# Patient Record
Sex: Male | Born: 1955 | Race: White | Hispanic: No | Marital: Single | Smoking: Never smoker
Health system: Southern US, Community
[De-identification: ages and names within clinical notes are randomized; demographics above are authoritative.]

## PROBLEM LIST (undated history)

## (undated) DIAGNOSIS — D721 Eosinophilia, unspecified: Secondary | ICD-10-CM

## (undated) DIAGNOSIS — J45909 Unspecified asthma, uncomplicated: Secondary | ICD-10-CM

## (undated) HISTORY — DX: Eosinophilia: D72.1

## (undated) HISTORY — DX: Eosinophilia, unspecified: D72.10

## (undated) HISTORY — DX: Unspecified asthma, uncomplicated: J45.909

---

## 1999-07-29 ENCOUNTER — Encounter: Payer: Self-pay | Admitting: Sports Medicine

## 1999-07-29 ENCOUNTER — Ambulatory Visit (HOSPITAL_COMMUNITY): Admission: RE | Admit: 1999-07-29 | Discharge: 1999-07-29 | Payer: Self-pay | Admitting: Sports Medicine

## 2005-05-31 ENCOUNTER — Ambulatory Visit (HOSPITAL_COMMUNITY): Admission: RE | Admit: 2005-05-31 | Discharge: 2005-05-31 | Payer: Self-pay | Admitting: Specialist

## 2005-12-19 ENCOUNTER — Ambulatory Visit: Payer: Self-pay | Admitting: Internal Medicine

## 2005-12-19 ENCOUNTER — Ambulatory Visit: Payer: Self-pay | Admitting: Pulmonary Disease

## 2005-12-21 ENCOUNTER — Ambulatory Visit (HOSPITAL_COMMUNITY): Admission: RE | Admit: 2005-12-21 | Discharge: 2005-12-21 | Payer: Self-pay | Admitting: Pulmonary Disease

## 2006-01-11 ENCOUNTER — Ambulatory Visit: Payer: Self-pay | Admitting: Cardiology

## 2006-01-16 ENCOUNTER — Encounter: Admission: RE | Admit: 2006-01-16 | Discharge: 2006-01-16 | Payer: Self-pay | Admitting: Thoracic Surgery

## 2006-04-04 ENCOUNTER — Ambulatory Visit: Payer: Self-pay | Admitting: Pulmonary Disease

## 2006-04-04 LAB — PULMONARY FUNCTION TEST

## 2007-01-06 IMAGING — CT CT ANGIO CHEST
2 of 5 series · 18 of 36 positions shown · IV contrast (APPLIED)
Comparison: none

CLINICAL DATA: Shortness of breath.  Cough.  Weight loss. 
CT ANGIOGRAPHY OF CHEST:
TECHNIQUE: Multidetector CT imaging of the chest was performed during bolus injection of intravenous contrast.  Multiplanar CT angiographic image reconstructions were generated to evaluate the vascular anatomy.
Contrast:  80 cc Omnipaque 300

[Series 6: pe 1.0 b20f st · axial · 0.68mm/px · z∈[-345,-61]mm · 15 of 326 slices shown]
[im 21/326  lung]
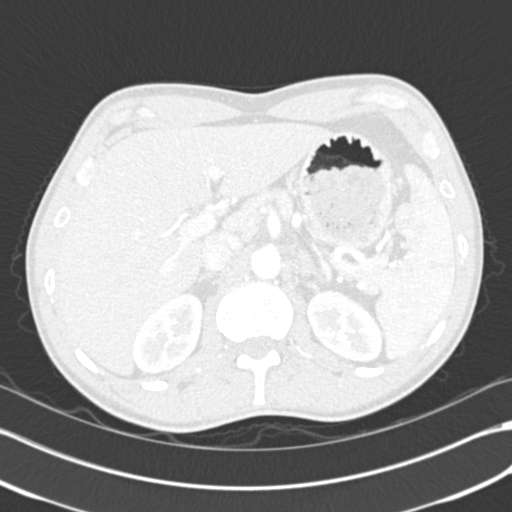
[im 41/326  mediastinal]
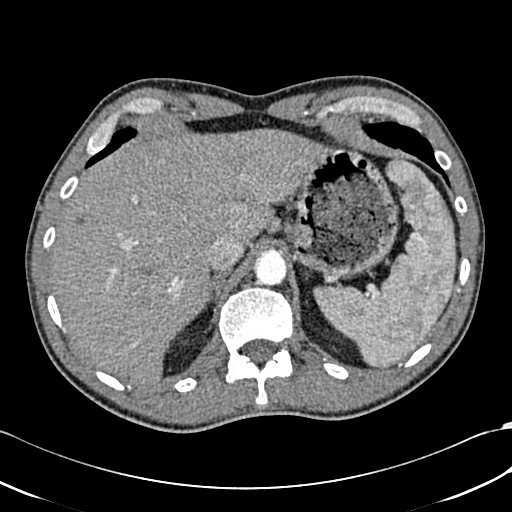
[im 61/326  lung]
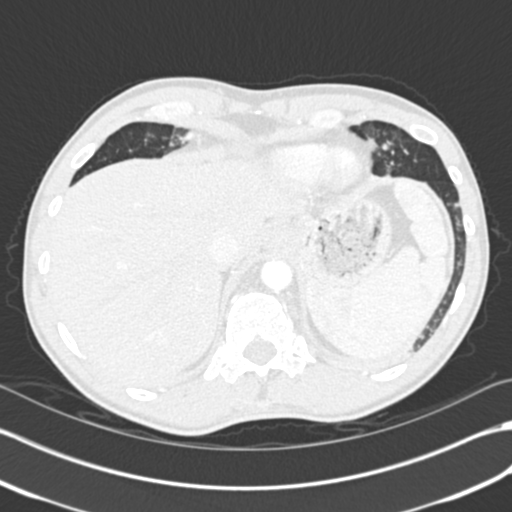
[im 82/326  mediastinal]
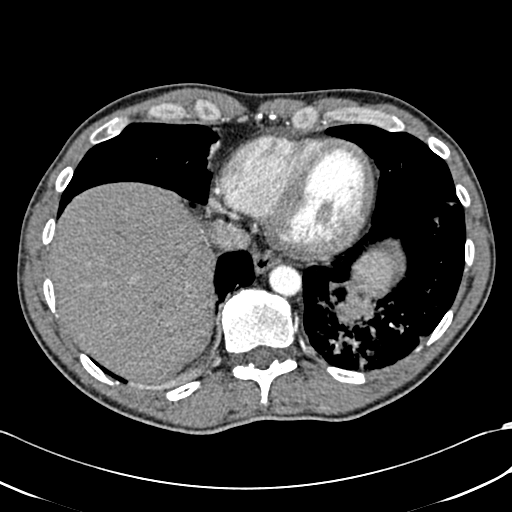
[im 102/326  lung]
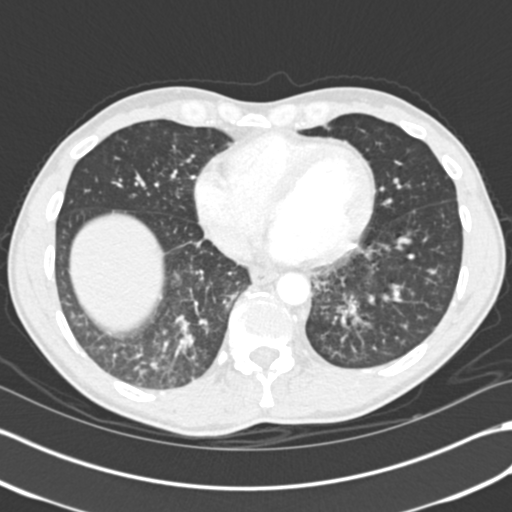
[im 122/326  mediastinal]
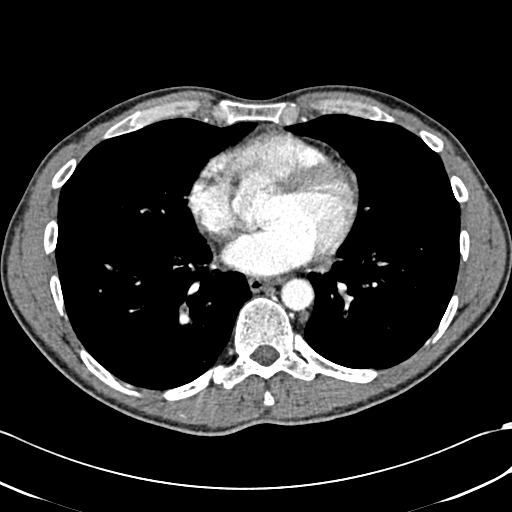
[im 143/326  lung]
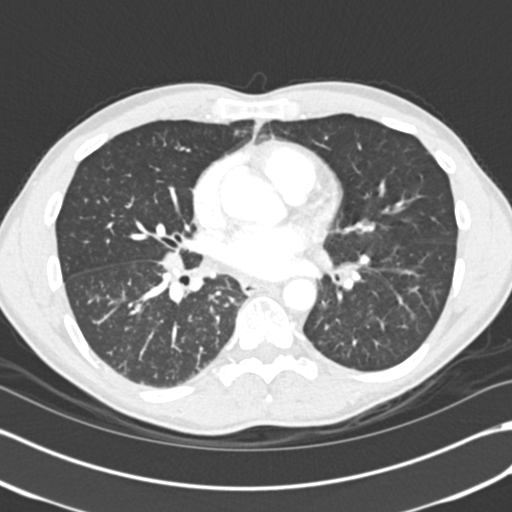
[im 163/326  mediastinal]
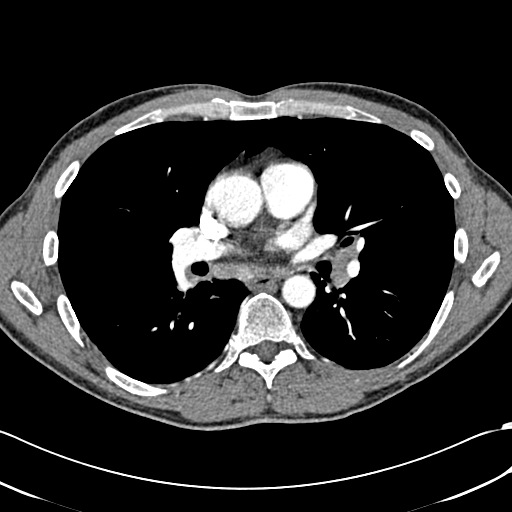
[im 183/326  lung]
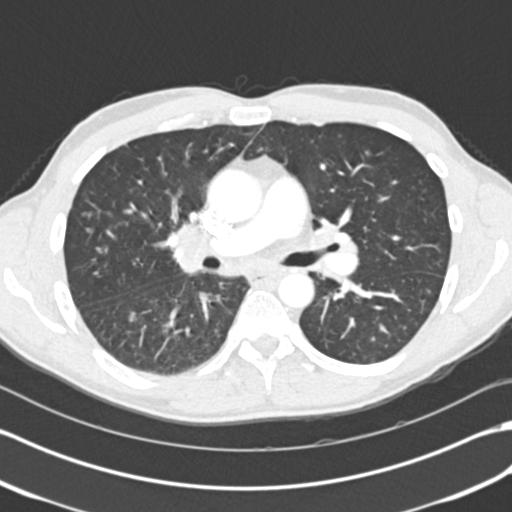
[im 204/326  mediastinal]
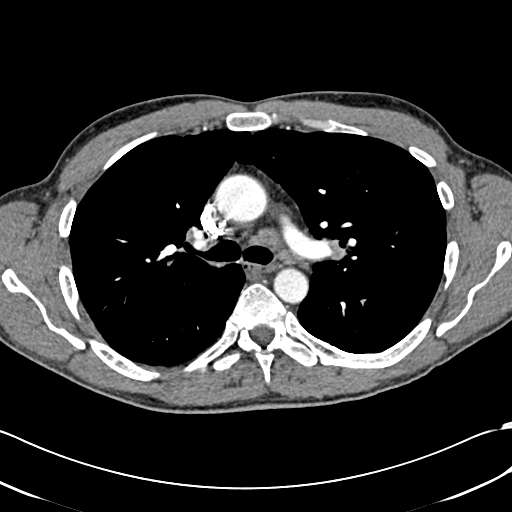
[im 224/326  lung]
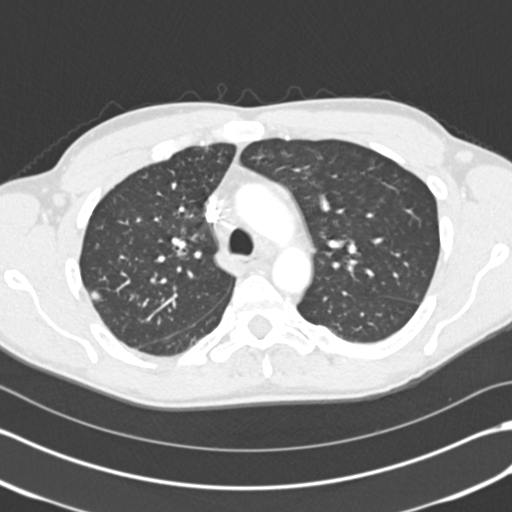
[im 244/326  mediastinal]
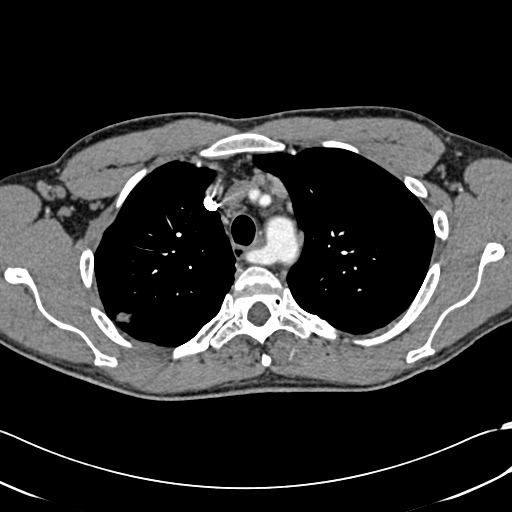
[im 265/326  lung]
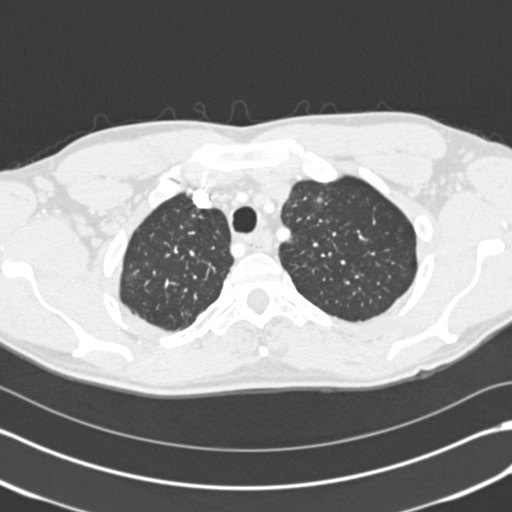
[im 285/326  mediastinal]
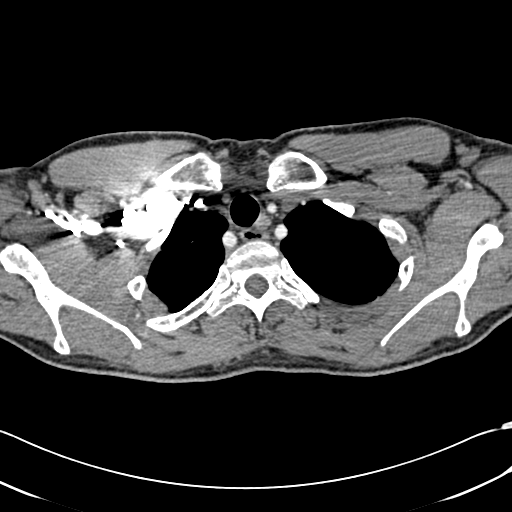
[im 305/326  lung]
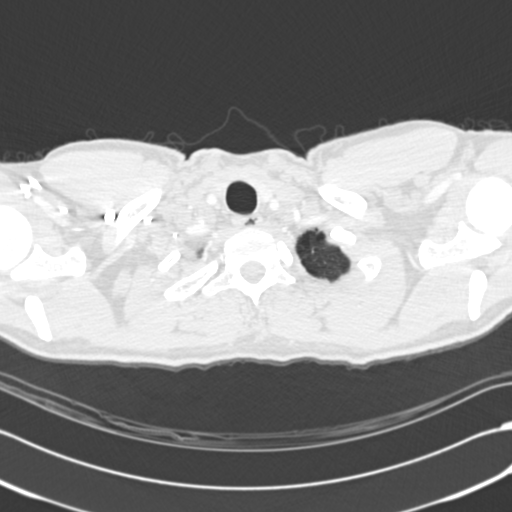

[Series 602: <mpr thick range> · coronal · 0.68mm/px · 3 of 39 slices shown]
[im 8/39  mediastinal]
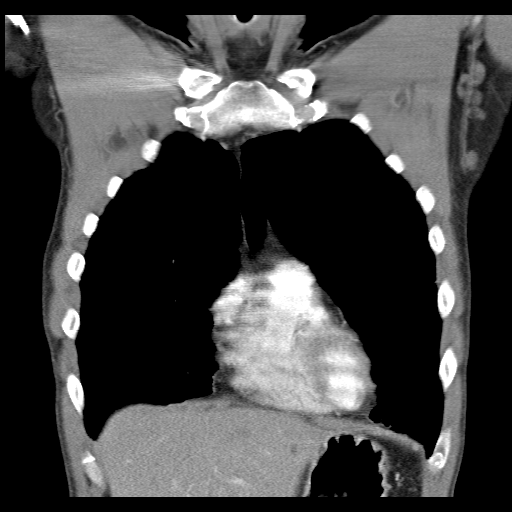
[im 16/39  mediastinal]
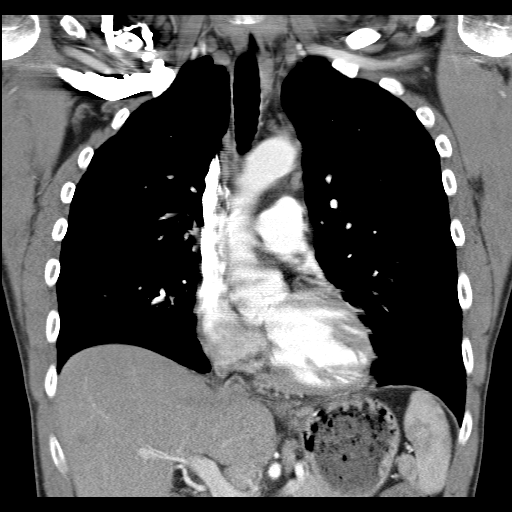
[im 23/39  mediastinal]
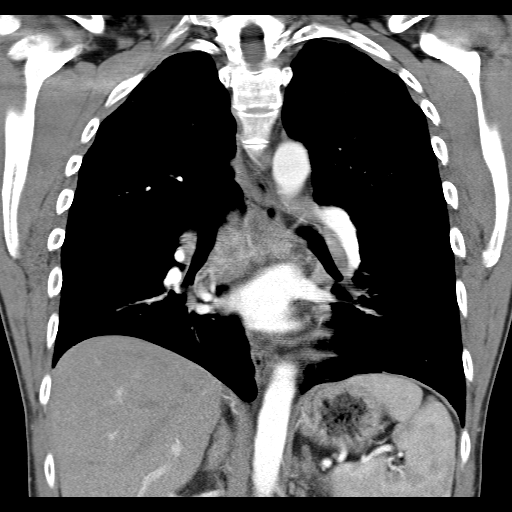

[18 of 36 positions shown; findings below may reference images not displayed]

FINDINGS: There is no CT scan evidence for pulmonary emboli.  There are bibasilar predominately interstitial infiltrative changes seen within the lung bases with a diffuse reticulo-nodular interstitial accentuation present particularly within the lower lobes.  In addition, there are ill-defined areas of nodularity seen within the lower lobes and also within the right upper lobe with the largest measurable nodule within the right upper lobe  measuring 8 x 14 mm in size as measured on image #27.  There are however innumerable smaller nodules present particularly within the lower lobes.  All of these nodules are ill-defined.  There is bilateral hilar adenopathy as well as mediastinal adenopathy with enlarged lymph nodes within the aorticopulmonary window region, paratracheal region, and subcarinal region.  An enlarged right hilar lymph node measures 2.2 x 3.0 cm on image #46.  An enlarged aorticopulmonary mediastinal lymph node measures 1.5 x 2.6 cm in size on image #39.  There are no pleural effusions.  Chest wall structures have a normal appearance.  There are mild enlarged bilateral axillary lymph nodes.  Also seen is aberrant origination of the right subclavian artery in the last branch from the aortic arch.
IMPRESSION: 1.  No CT scan evidence for pulmonary emboli.
2.  Bilateral hilar and diffuse mediastinal adenopathy as well as bilateral axillary adenopathy.  This is seen in association with interstitial lung disease, bibasilar predominately interstitial infiltrates, and multiple bilateral ill-defined parenchymal pulmonary nodules.  Major differential possibilities include sarcoidosis, lymphoma, metastatic disease, and immunocompromise states (AIDS) with or without opportunistic infections.  
3.  Incidentally noted is aberrant origin of the right subclavian artery as a distal branch of the aortic arch.

## 2007-01-08 IMAGING — CR DG CHEST 1V PORT
1 series · 1 of 1 positions shown · non-contrast
Comparison: none

CLINICAL DATA: Difficulty breathing status post bronchoscopy.  
 PORTABLE CHEST- 1 VIEW:
 An AP semierect portable film of the chest made 12/21/05 at 0402 hours is compared to a CT angio. of the chest dated 12/19/05 and shows diffuse peribronchial thickening with bilateral basilar atelectasis.  There is no definite pneumothorax or hemorrhage.  The heart and mediastinum appear to be within the normal limit.  Bony thorax appears normal.

[view not recorded]
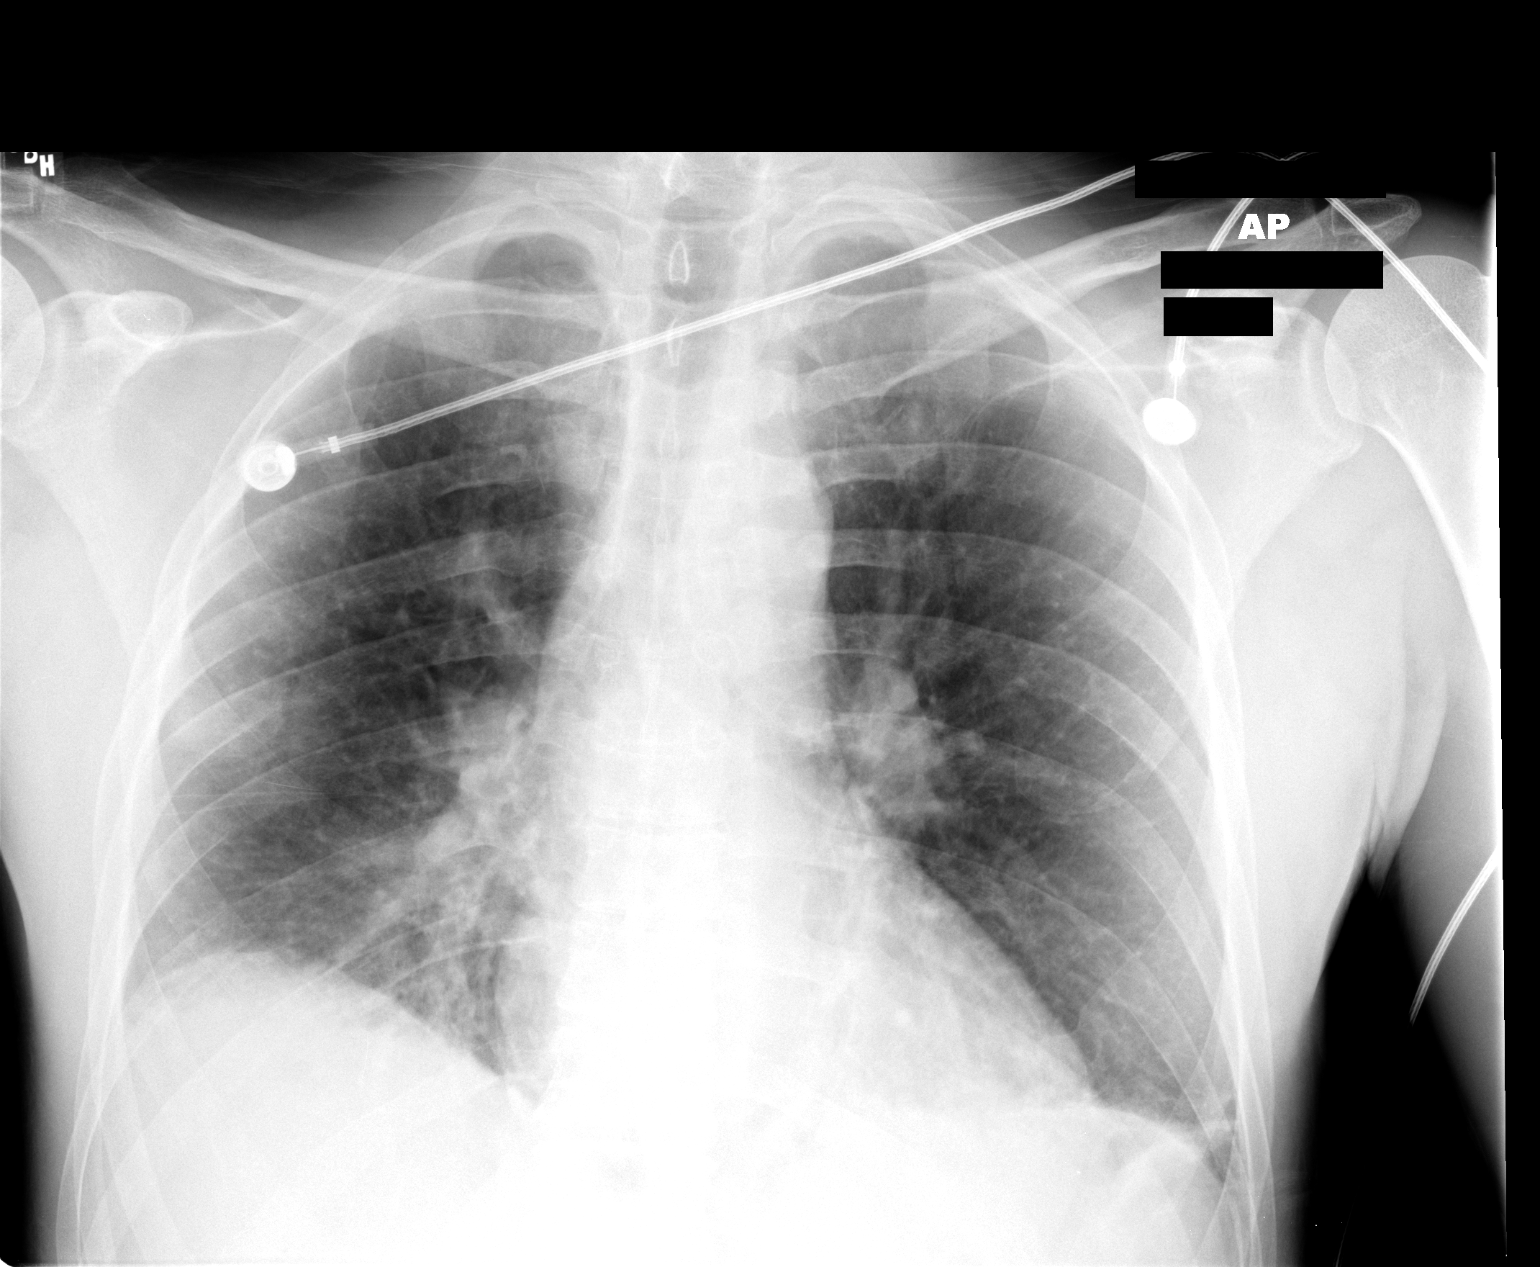

[1 of 1 positions shown; findings below may reference images not displayed]

IMPRESSION: Diffuse peribronchial thickening.  Bilateral basilar atelectasis.  No pneumothorax.

## 2007-06-15 LAB — PULMONARY FUNCTION TEST

## 2010-11-23 ENCOUNTER — Encounter: Payer: Self-pay | Admitting: Pulmonary Disease

## 2010-12-01 NOTE — Miscellaneous (Signed)
  Clinical Lists Changes  Medications: Added new medication of ASTEPRO 0.15 % SOLN (AZELASTINE HCL) 2 sprays each nostril once a day - Signed Rx of ASTEPRO 0.15 % SOLN (AZELASTINE HCL) 2 sprays each nostril once a day;  #1 x 12;  Signed;  Entered by: Barbaraann Share MD;  Authorized by: Barbaraann Share MD;  Method used: Electronically to [facility] [address], [place], [place]  [id number], Ph: [phone], Fax: [phone]    Prescriptions: ASTEPRO 0.15 % SOLN (AZELASTINE HCL) 2 sprays each nostril once a day  #1 x 12   Entered and Authorized by:   Barbaraann Share MD   Signed by:   Barbaraann Share MD on 11/23/2010   Method used:   Electronically to        [facility]* (retail)       [address]       [place], [place]  [id number]       Ph: [phone]       Fax: [phone]   RxID:   [id number]

## 2011-05-11 ENCOUNTER — Other Ambulatory Visit: Payer: Self-pay | Admitting: *Deleted

## 2011-05-11 MED ORDER — BUDESONIDE-FORMOTEROL FUMARATE 160-4.5 MCG/ACT IN AERO: 2.0000 | INHALATION_SPRAY | Freq: Two times a day (BID) | RESPIRATORY_TRACT | Status: DC

## 2011-05-11 MED ORDER — ALBUTEROL SULFATE HFA 108 (90 BASE) MCG/ACT IN AERS: 2.0000 | INHALATION_SPRAY | Freq: Four times a day (QID) | RESPIRATORY_TRACT | Status: DC | PRN

## 2011-11-09 ENCOUNTER — Other Ambulatory Visit: Payer: Self-pay | Admitting: Pulmonary Disease

## 2011-11-09 MED ORDER — ACYCLOVIR 200 MG PO CAPS: 400.0000 mg | ORAL_CAPSULE | Freq: Three times a day (TID) | ORAL | Status: DC

## 2012-01-31 ENCOUNTER — Other Ambulatory Visit: Payer: Self-pay | Admitting: Pulmonary Disease

## 2012-01-31 MED ORDER — PREDNISONE 10 MG PO TABS: ORAL_TABLET | ORAL | Status: DC

## 2012-04-30 ENCOUNTER — Ambulatory Visit (INDEPENDENT_AMBULATORY_CARE_PROVIDER_SITE_OTHER): Payer: Self-pay | Admitting: Pulmonary Disease

## 2012-04-30 ENCOUNTER — Encounter: Payer: Self-pay | Admitting: Pulmonary Disease

## 2012-04-30 VITALS — BP 112/82 | HR 62 | Temp 98.0°F | Ht 71.5 in | Wt 161.8 lb

## 2012-04-30 DIAGNOSIS — D721 Eosinophilia, unspecified: Secondary | ICD-10-CM | POA: Insufficient documentation

## 2012-04-30 DIAGNOSIS — J4530 Mild persistent asthma, uncomplicated: Secondary | ICD-10-CM | POA: Insufficient documentation

## 2012-04-30 DIAGNOSIS — J45901 Unspecified asthma with (acute) exacerbation: Secondary | ICD-10-CM

## 2012-04-30 DIAGNOSIS — J45909 Unspecified asthma, uncomplicated: Secondary | ICD-10-CM | POA: Insufficient documentation

## 2012-04-30 NOTE — Assessment & Plan Note (Signed)
I suspect the patient has an episode of acute asthmatic bronchitis, probably brought on by viral URI.  There is nothing to suggest an ongoing sinopulmonary infection at this time, but he is clearly having persistent airway inflammation that needs to be treated.  I will treat him with a course of prednisone, and have stressed to him the importance of getting back on symbicort twice a day.  If he does this, and continues to have flareups or doesn't return to baseline, he may need a chest x-ray and PFTs given his prior history.  He is to also continue his neil med sinus rinses.

## 2012-04-30 NOTE — Progress Notes (Signed)
  Subjective:    Patient ID: Roy Thomas, male    DOB: 04-27-1956, 56 y.o.   MRN: [id number]  HPI The pt comes in today for an acute sick visit.  He has known asthma, along with a distant h/o eosinophilic lung disease that is uncharacterized and resolved with a course of prednisone.  He was doing well until this year, and has had 3 "flareups" most c/w acute asthma exacerbation since that time.  He was treated with a course of prednisone in Feb with great improvement, and then started having increased asthma symptoms the end of June associated with viral URI/sinus symptoms.  He has not had purulent mucus from nose or chest, but has ongoing chest tightness with increased doe over baseline.  It turns out he has not been using symbicort but once a day.    Review of Systems  Constitutional: Negative for fever and unexpected weight change.  HENT: Negative for ear pain, nosebleeds, congestion, sore throat, rhinorrhea, sneezing, trouble swallowing, dental problem, postnasal drip and sinus pressure.   Eyes: Negative for redness and itching.  Respiratory: Positive for cough. Negative for chest tightness, shortness of breath and wheezing.   Cardiovascular: Negative for palpitations and leg swelling.  Gastrointestinal: Negative for nausea and vomiting.  Genitourinary: Negative for dysuria.  Musculoskeletal: Negative for joint swelling.  Skin: Negative for rash.  Neurological: Negative for headaches.  Hematological: Does not bruise/bleed easily.  Psychiatric/Behavioral: Negative for dysphoric mood. The patient is not nervous/anxious.   All other systems reviewed and are negative.       Objective:   Physical Exam Wd male in nad Nose with inflammed mucosa, no purulence OP clear Chest with one isolated wheeze in right base, good airflow bilat. Cor with rrr LE without edema, no cyanosis Alert and oriented, moves all 4.        Assessment & Plan:

## 2012-04-30 NOTE — Patient Instructions (Addendum)
Stay on symbicort bid as prescribed. Will treat with a course of prednisone to get you thru this episode. If you are not returning to baseline, you need to call me.

## 2012-07-13 ENCOUNTER — Other Ambulatory Visit: Payer: Self-pay | Admitting: Pulmonary Disease

## 2012-07-13 MED ORDER — AZELASTINE HCL 0.15 % NA SOLN: 2.0000 | Freq: Every day | NASAL | Status: DC

## 2012-07-13 NOTE — Telephone Encounter (Signed)
  Phone per Dr. Shelle Iron, refill pt's astepro for pt. Call to [facility] in [place] at [phone].

## 2012-08-30 ENCOUNTER — Other Ambulatory Visit: Payer: Self-pay | Admitting: Pulmonary Disease

## 2012-08-30 ENCOUNTER — Telehealth: Payer: Self-pay | Admitting: Pulmonary Disease

## 2012-08-30 MED ORDER — BUDESONIDE-FORMOTEROL FUMARATE 160-4.5 MCG/ACT IN AERO: 2.0000 | INHALATION_SPRAY | Freq: Two times a day (BID) | RESPIRATORY_TRACT | Status: DC

## 2012-08-30 NOTE — Telephone Encounter (Signed)
Needs to have his symbicort 160/4.5 sent [facility] [address], I think in [place]? [phone] #1 with 11 fills.

## 2012-08-30 NOTE — Telephone Encounter (Signed)
Symbicort RX called to the pharmacy, [facility] in [place].

## 2012-12-07 ENCOUNTER — Encounter: Payer: Self-pay | Admitting: Pulmonary Disease

## 2013-07-06 ENCOUNTER — Other Ambulatory Visit: Payer: Self-pay | Admitting: Pulmonary Disease

## 2013-07-06 DIAGNOSIS — B009 Herpesviral infection, unspecified: Secondary | ICD-10-CM

## 2013-07-06 MED ORDER — ACYCLOVIR 200 MG PO CAPS: 400.0000 mg | ORAL_CAPSULE | Freq: Three times a day (TID) | ORAL | Status: AC

## 2013-11-01 ENCOUNTER — Telehealth: Payer: Self-pay | Admitting: Pulmonary Disease

## 2013-11-01 MED ORDER — ACYCLOVIR 200 MG PO CAPS: 400.0000 mg | ORAL_CAPSULE | Freq: Three times a day (TID) | ORAL | Status: DC

## 2013-11-01 NOTE — Telephone Encounter (Signed)
Please send in a prescription for this pt:   Acyclovir 200mg , take 2 tid for 5 days.  One fill.   Send to : [facility], [address], [place].   Thanks.

## 2013-11-01 NOTE — Telephone Encounter (Signed)
Rx [facility] [place].

## 2013-11-06 ENCOUNTER — Telehealth: Payer: Self-pay | Admitting: Pulmonary Disease

## 2013-11-06 NOTE — Telephone Encounter (Signed)
Ashtyn, this pt had a recent cxr in [place], and a disk was sent by [employer] to me.  It apparently arrived today, and was signed for by J.Miller.  We need to get this disk asap since the pt is sick.  Thanks.

## 2013-11-07 NOTE — Telephone Encounter (Signed)
Dr Shelle Ironlance found disc in mail room Report [facility] Nothing further needed at this time.

## 2013-12-03 ENCOUNTER — Other Ambulatory Visit: Payer: Self-pay | Admitting: Pulmonary Disease

## 2013-12-04 ENCOUNTER — Telehealth: Payer: Self-pay | Admitting: Pulmonary Disease

## 2013-12-04 MED ORDER — BUDESONIDE-FORMOTEROL FUMARATE 160-4.5 MCG/ACT IN AERO: 2.0000 | INHALATION_SPRAY | Freq: Two times a day (BID) | RESPIRATORY_TRACT | Status: DC

## 2013-12-04 NOTE — Telephone Encounter (Signed)
Needs script sent to pharm in [place] for his symbicort #1 with 12 fills.

## 2013-12-04 NOTE — Telephone Encounter (Addendum)
[place]verified with Pharmacy Nothing further needed

## 2013-12-04 NOTE — Telephone Encounter (Signed)
Need to send in month to month prescription for his symbicort with 12 fills to his ;pharm in [place]

## 2014-09-27 ENCOUNTER — Other Ambulatory Visit: Payer: Self-pay | Admitting: Pulmonary Disease

## 2014-09-27 MED ORDER — BUDESONIDE-FORMOTEROL FUMARATE 160-4.5 MCG/ACT IN AERO: 2.0000 | INHALATION_SPRAY | Freq: Two times a day (BID) | RESPIRATORY_TRACT | Status: DC

## 2014-10-13 ENCOUNTER — Telehealth: Payer: Self-pay | Admitting: Pulmonary Disease

## 2014-10-13 MED ORDER — ACYCLOVIR 200 MG PO CAPS: 400.0000 mg | ORAL_CAPSULE | Freq: Three times a day (TID) | ORAL | Status: DC | PRN

## 2014-10-13 NOTE — Telephone Encounter (Signed)
Mindy, can you refill this pt's acyclovir.  [facility]. Thanks.

## 2014-10-13 NOTE — Telephone Encounter (Signed)
RX has been refilled. Nothing further needed 

## 2015-04-07 NOTE — Telephone Encounter (Signed)
error 

## 2016-02-03 ENCOUNTER — Telehealth: Payer: Self-pay

## 2016-02-03 NOTE — Telephone Encounter (Signed)
Per BQ, pt needs rov with cbc, cxr, and pft scheduled for next Wednesday afternoon at 1:30.  Pt declined pft, but aware of lab and cxr order placed, and will be seen at 1:30 next Wednesday by BQ.  Nothing further needed.

## 2016-02-10 ENCOUNTER — Ambulatory Visit (INDEPENDENT_AMBULATORY_CARE_PROVIDER_SITE_OTHER)
Admission: RE | Admit: 2016-02-10 | Discharge: 2016-02-10 | Disposition: A | Discharge status: Home / Self Care | Payer: Self-pay | Source: Ambulatory Visit | Attending: Pulmonary Disease | Admitting: Pulmonary Disease

## 2016-02-10 ENCOUNTER — Encounter: Payer: Self-pay | Admitting: Pulmonary Disease

## 2016-02-10 ENCOUNTER — Other Ambulatory Visit (INDEPENDENT_AMBULATORY_CARE_PROVIDER_SITE_OTHER): Payer: Self-pay

## 2016-02-10 ENCOUNTER — Telehealth: Payer: Self-pay | Admitting: Pulmonary Disease

## 2016-02-10 ENCOUNTER — Ambulatory Visit (INDEPENDENT_AMBULATORY_CARE_PROVIDER_SITE_OTHER): Payer: Self-pay | Admitting: Pulmonary Disease

## 2016-02-10 VITALS — BP 138/82 | HR 65 | Ht 71.5 in | Wt 162.0 lb

## 2016-02-10 DIAGNOSIS — D721 Eosinophilia, unspecified: Secondary | ICD-10-CM

## 2016-02-10 DIAGNOSIS — J45909 Unspecified asthma, uncomplicated: Secondary | ICD-10-CM

## 2016-02-10 DIAGNOSIS — J4530 Mild persistent asthma, uncomplicated: Secondary | ICD-10-CM

## 2016-02-10 DIAGNOSIS — J4540 Moderate persistent asthma, uncomplicated: Secondary | ICD-10-CM

## 2016-02-10 LAB — CBC WITH DIFFERENTIAL/PLATELET
Basophils Absolute: 0 10*3/uL (ref 0.0–0.1)
Basophils Relative: 0.5 % (ref 0.0–3.0)
EOS PCT: 2.4 % (ref 0.0–5.0)
Eosinophils Absolute: 0.1 10*3/uL (ref 0.0–0.7)
HEMATOCRIT: 49.3 % (ref 39.0–52.0)
Hemoglobin: 17.2 g/dL — ABNORMAL HIGH (ref 13.0–17.0)
LYMPHS ABS: 1.4 10*3/uL (ref 0.7–4.0)
LYMPHS PCT: 24.4 % (ref 12.0–46.0)
MCHC: 34.8 g/dL (ref 30.0–36.0)
MCV: 97.7 fl (ref 78.0–100.0)
MONOS PCT: 11.3 % (ref 3.0–12.0)
Monocytes Absolute: 0.7 10*3/uL (ref 0.1–1.0)
NEUTROS PCT: 61.4 % (ref 43.0–77.0)
Neutro Abs: 3.5 10*3/uL (ref 1.4–7.7)
Platelets: 220 10*3/uL (ref 150.0–400.0)
RBC: 5.05 Mil/uL (ref 4.22–5.81)
RDW: 13.2 % (ref 11.5–15.5)
WBC: 5.8 10*3/uL (ref 4.0–10.5)
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
## 2016-02-10 NOTE — Assessment & Plan Note (Signed)
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 I have first layer review the images from his CT chest at that time that showed a tree-in-bud abnormality in the bases of his lungs. Transbronchial biopsies showed eosinophilic infiltrate in the alveolar septi but not in the alveolar spaces. There is questionable eosinophils in the walls of the small arteries. The BAL only showed 12% eos. He was treated with steroids with a good clinical response.  His eosinophil count today was normal. His spirometry today was normal.  Since then, it sounds as if his symptoms have been consistent with mild persistent asthma. He has periodic flareups which seem to be consistent with environmental exposures rather than viral exposures or infections. In general, his symptoms are well controlled as illustrated by the fact that he was able to cycle over 40 miles at high-altitude this past weekend.  Plan: Spirometry today for baseline Continue Symbicort twice a day Call us for flareups Follow-up 6 months as we know he will be in town at that time, likely annually after that

## 2016-02-10 NOTE — Telephone Encounter (Signed)
Patient came by office to get orders put in for his CXR and CBC (see TE dated 02/03/16).  Orders have been entered.  Patient aware and has gone to have CXR and CBC done now.  FYI to [employer]

## 2016-02-10 NOTE — Patient Instructions (Signed)
Keep taking the Symbicort as you are doing We will see you back in 6 months or sooner if needed Please take note of the "my chart" information on this document which will give you information about how to contact me

## 2016-08-23 ENCOUNTER — Telehealth: Payer: Self-pay | Admitting: Pulmonary Disease

## 2016-08-23 MED ORDER — BUDESONIDE-FORMOTEROL FUMARATE 160-4.5 MCG/ACT IN AERO: 2.0000 | INHALATION_SPRAY | Freq: Two times a day (BID) | RESPIRATORY_TRACT | 12 refills | Status: DC

## 2016-08-23 MED ORDER — TRIAMCINOLONE ACETONIDE 55 MCG/ACT NA AERO: 1.0000 | INHALATION_SPRAY | Freq: Every day | NASAL | 6 refills | Status: DC | PRN

## 2016-08-23 NOTE — Telephone Encounter (Signed)
Called and spoke with pt and he stated that his work has made [place] at least [place].  He stated that he has had labs done and the cbcd has been about the same as back in April. He will schedule an appt once he gets back into town.  Will forward to BQ to make him aware.

## 2016-08-24 NOTE — Telephone Encounter (Signed)
Noted, thanks!

## 2017-05-02 ENCOUNTER — Other Ambulatory Visit: Payer: Self-pay | Admitting: Pulmonary Disease

## 2017-06-21 ENCOUNTER — Telehealth: Payer: Self-pay | Admitting: Pulmonary Disease

## 2017-06-22 MED ORDER — BUDESONIDE-FORMOTEROL FUMARATE 160-4.5 MCG/ACT IN AERO: 2.0000 | INHALATION_SPRAY | Freq: Two times a day (BID) | RESPIRATORY_TRACT | 2 refills | Status: DC

## 2017-06-22 MED ORDER — ALBUTEROL SULFATE HFA 108 (90 BASE) MCG/ACT IN AERS: INHALATION_SPRAY | RESPIRATORY_TRACT | 2 refills | Status: DC

## 2017-06-22 NOTE — Telephone Encounter (Signed)
LM for patient x 1 
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
## 2017-06-22 NOTE — Telephone Encounter (Signed)
Agree, thanks

## 2017-08-17 ENCOUNTER — Telehealth: Payer: Self-pay | Admitting: Pulmonary Disease

## 2017-08-17 MED ORDER — BUDESONIDE-FORMOTEROL FUMARATE 160-4.5 MCG/ACT IN AERO: 2.0000 | INHALATION_SPRAY | Freq: Two times a day (BID) | RESPIRATORY_TRACT | 11 refills | Status: DC

## 2017-08-17 NOTE — Telephone Encounter (Signed)
lmtcb x1 for pt. 

## 2017-08-17 NOTE — Telephone Encounter (Signed)
Yes ok to refill, 11 refills

## 2017-08-17 NOTE — Telephone Encounter (Signed)
Attempted to fax signed Rx to provided number and received busy signal multiple times.  lmtcb x1 for pt to verify fax number. Rx is hanging up on brown brown in triage.

## 2017-08-17 NOTE — Telephone Encounter (Signed)
Pt is aware of below message and voiced his understanding.  Rx has been printed and placed on BQ's desk for signature.
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
## 2017-08-17 NOTE — Telephone Encounter (Signed)
Patient returning call - he can be reached at [phone]

## 2017-08-18 NOTE — Telephone Encounter (Signed)
Spoke with the pt  He asked that I take a pic on a smart phone and send it to him and he will take it to the pharm  I have done so on my personal phone  Pt will call back if they will not accept this

## 2017-08-18 NOTE — Telephone Encounter (Signed)
The fax number can not be correct b/c there are too many numbers  LMTCB for the pt

## 2017-08-18 NOTE — Telephone Encounter (Addendum)
I tried faxing to the number given, and again it did not go through  CrismanSpoke with the pt and he now wants it to be emailed  Unsure how to accommodate him when we do not email rxs  Skype sent to EE to help with this

## 2017-08-18 NOTE — Telephone Encounter (Signed)
Pt called back and left a fax# and email address to have prescription sent.the patient contact # [phone]  Fax # [phone] Email: [email or web address]

## 2018-04-10 ENCOUNTER — Telehealth: Payer: Self-pay | Admitting: Pulmonary Disease

## 2018-04-10 NOTE — Telephone Encounter (Signed)
Called and spoke with pt to see what test pt was requested to have done by BQ before an OV is scheduled.  Pt states he will be driving from [place] leaving Saturday, 6/22 and states it might be Tuesday, 6/22 before he is in the states and is going to be in  until July 4.  Dr. Kendrick FriesMcQuaid, please advise if pt does need to have any testing done prior to OV. Pt states he believes a PFT might have been mentioned but wants to make sure before scheduling appt.

## 2018-04-10 NOTE — Telephone Encounter (Signed)
Called patient unable to reach left message to give us a call back.

## 2018-04-10 NOTE — Telephone Encounter (Signed)
Spirometry and FENO

## 2018-04-11 NOTE — Telephone Encounter (Signed)
Pt has been scheduled for OV with Elisha HeadlandBrian Mack on 04/23/18. Pt is aware and voiced his understanding. Added spiro and feno to apt note per below message.  Nothing further is needed.   Routing to BQ as an [place].

## 2018-04-11 NOTE — Telephone Encounter (Signed)
Prefer me, but if I don't have any availability then NP and if I'm in office I can pop in and say hi while he is there

## 2018-04-11 NOTE — Telephone Encounter (Signed)
Patient returning call.  CB is [phone].

## 2018-04-11 NOTE — Telephone Encounter (Signed)
Dr. Kendrick FriesMcQuaid, please advise if you want pt to see you directly or if it is okay to schedule pt with an APP?  Pt needs the appt to be on a day 6/26-7/3 as that is when he will be in town before he drives back to [place].

## 2018-04-11 NOTE — Telephone Encounter (Signed)
Attempted to call pt. I did not receive an answer. I have left a message for pt to return our call.  

## 2018-04-23 ENCOUNTER — Ambulatory Visit (INDEPENDENT_AMBULATORY_CARE_PROVIDER_SITE_OTHER): Payer: Self-pay | Admitting: Pulmonary Disease

## 2018-04-23 ENCOUNTER — Encounter: Payer: Self-pay | Admitting: Pulmonary Disease

## 2018-04-23 ENCOUNTER — Other Ambulatory Visit: Payer: Self-pay | Admitting: Pulmonary Disease

## 2018-04-23 ENCOUNTER — Ambulatory Visit (INDEPENDENT_AMBULATORY_CARE_PROVIDER_SITE_OTHER)
Admission: RE | Admit: 2018-04-23 | Discharge: 2018-04-23 | Disposition: A | Discharge status: Home / Self Care | Payer: Self-pay | Source: Ambulatory Visit | Attending: Pulmonary Disease | Admitting: Pulmonary Disease

## 2018-04-23 VITALS — BP 142/88 | HR 61 | Ht 71.0 in | Wt 155.0 lb

## 2018-04-23 DIAGNOSIS — D721 Eosinophilia, unspecified: Secondary | ICD-10-CM

## 2018-04-23 DIAGNOSIS — J4530 Mild persistent asthma, uncomplicated: Secondary | ICD-10-CM

## 2018-04-23 DIAGNOSIS — B370 Candidal stomatitis: Secondary | ICD-10-CM

## 2018-04-23 LAB — POCT EXHALED NITRIC OXIDE: FENO LEVEL (PPB): 22

## 2018-04-23 MED ORDER — NYSTATIN 100000 UNIT/ML MT SUSP: 5.0000 mL | Freq: Four times a day (QID) | OROMUCOSAL | 2 refills | Status: DC

## 2018-04-23 NOTE — Assessment & Plan Note (Signed)
  Thrush  >>> Nystatin Rinse  >>> Will call order into [place] pharmacy >>>We will let them know that you will not be able to pick up until after 04/30/18

## 2018-04-23 NOTE — Assessment & Plan Note (Signed)
FeNo today  >>>22 Come back in 2 months  >>>Pulmonary Function Test at next appointment    Continue Symbicort  >>>2 puffs in the morning, rinse mouth, 2 puffs 12 hours later, rinse mouth   Follow-up in 2 months.  Contact our office as soon as you know when you will be able to coordinate a trip to [place] so we can get you scheduled with Dr. Kendrick FriesMcquaid as well as get your pulmonary function test scheduled.

## 2018-04-23 NOTE — Progress Notes (Signed)
Continue call the patient and let him know that his chest x-ray today was normal.  Showing no acute issues.  After consulting with Dr. Kendrick FriesMcquaid Dr. Kendrick FriesMcquaid is okay with us proceeding forward with a PRN prednisone order.  We will write for prednisone 20 mg with 30 tablets no refills.  Patient can take 20 mg of prednisone daily for 5 days, take with food.  Use this if you are having exacerbation.    Please place this order.   Notify our office whenever you need utilize this.  Continue with plan of following up with our office in 2 months to see Dr. Kendrick FriesMcquaid before moving to [place].  Elisha HeadlandBrian Mack, FNP

## 2018-04-23 NOTE — Progress Notes (Signed)
 @Patient  ID: Roy Thomas, male    DOB: 05-11-1956, 62 y.o.   MRN: [id number]  Chief Complaint  Patient presents with  . Follow-up    States he has been up all night. Has questions about Churg-strauss syndrome. States he had 3 exacerbatins within the last year. lasting 6-7 weeks.     Referring provider: No ref. provider found  HPI: This is a lifelong nonsmoker and a former patient of Roy Thomas who was first evaluated in [place] by pulmonary in 2007. At that time he presented with significant shortness of breath and cough and had a CT scan which showed significant tree-in-bud abnormalities in the bases of both lungs. A bronchoscopy was performed which showed elevated eosinophils (12%) and transbronchial biopsies which showed an eosinophilic infiltrate in the small vessels and in the alveolar septi. He was treated with prednisone and he had improvement in symptoms. Since then he's taken Symbicort and has done fairly well. He has been treated intermittently for asthmatic flares but has not had recurrence of the abnormal findings on chest x-ray. Pt lives in [place]. Does not have pulmonologist in [place].   Recent [facility] Encounters:     02/10/16 - OV - McQuaid  See my summary above created today. Mr. 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 In 2007 as detailed above he presented and was treated. See that above Since then he's been living in [place] and will intermittently returned to [place]. He says that he's done quite well recently. However, in August 2016 he did have a flareup of shortness of breath and cough and was treated with a brief prednisone taper. He says that when he has flareups it tends to be more related to his environmental exposures. He does not often get viral infections or other infections. He says that dust,  sometimes asphalt will change the quality of his breathing. However, he's been very consistent with his Symbicort use for the last several years and he says that this has controlled his symptoms significantly. He remains quite active. On Saturday he went for a bicycle ride which was 45 miles in length somewhere between 8 and 11,000 feet above sea level. He says that he tolerated that well. He has intermittently had his eosinophil count checked over the years at point of care lab tests and he says that they have been higher when these had more symptoms in [place]. 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 He is frequently exposed to various dusts and chemicals and fumes. He has never smoked.   Tests:  Presented 2007 with severe dyspnea, cough, and abnormal cxr with pfts showing airflow obstruction. CT chest 2007:  Basilar interstitial and nodular infiltrates bilat with bilat hilar and mediastinal LN Blood eosinophilia 2007:  12% on diff IgE 141, HP panel negative, ANCA negative Bronch 2007:  BAL with 9550 cells, 12% eos, negative cultures TBBX 2007:  Eosinophilic infiltrate in alveolar septae, but not in alveolar spaces.  +eosinophils in walls of small arteries?? ++response to steroids [facility] [place]:  Recommended VATS bx just as I had, but pt declined.   April 2017 eosinophil count 100 cells per microliter, spirometry normal   Imaging:   04/23/2018-chest x-ray-heart size and mediastinal contours, visualized skeletal structures are unremarkable.  No active cardiopulmonary disease.  Cardiac:   02/10/2016-spirometry-normal spirometry  Labs:   Micro:   Chart Review:    04/23/18 OV 62 year old patient seen for follow-up appointment today.  Patient was a former patient of Roy Thomas and has seen Roy Thomas one time in 2017.  Patient lives in [place], [place].  But is potentially moving to [place] in  2 to 3 months.  Patient is presenting today for follow-up appointment as well as to see if we will continue to help manage respiratory status when he lives in [place].  Patient also looking to receive a as needed supply of prednisone for when he travels to [place].  FeNo today is 22.  Spirometry unfortunately today is broken.   Patient reports that over the last year and a half patient has had 3 episodes that are primarily respiratory driven.  Patient reports that his health has been poor over the last 16 months.  But the past 2 weeks he has had a good interval.  Patient is not established with any pulmonology team in [place].  Patient [place]  Patient does report [facility]up with [facility] but 7 hours away care and quality of care is better here.  Patient reports that he is still doing well, still riding his bike, still rockclimbing, has received telephonic care from friends who are medical providers.  He reports that back in February/2019 through March/2019 he had his last bout of a respiratory exacerbation.  Patient did not receive any sort of acute treatment at that time.  Patient does report that in summer 2018 patient needed a prednisone taper to tapers in order to overcome respiratory status.   No Known Allergies   There is no immunization history on file for this patient.  Past Medical History:  Diagnosis Date  . Asthma   . Eosinophilic disorder     Tobacco History: Social History   Tobacco Use  Smoking Status Never Smoker  Smokeless Tobacco Never Used   Counseling given: Not Answered   Outpatient Encounter Medications as of 04/23/2018  Medication Sig  . albuterol (PROAIR HFA) 108 (90 Base) MCG/ACT inhaler inhale 2 puffs by mouth every 6 hours if needed  . budesonide-formoterol (SYMBICORT) 160-4.5 MCG/ACT inhaler Inhale 2 puffs into the lungs 2 (two) times daily.  Marland Kitchen triamcinolone (NASACORT ALLERGY 24HR) 55 MCG/ACT AERO nasal inhaler  Place 1 spray into the nose daily as needed.  . Azelastine HCl 0.15 % SOLN Place 2 sprays into the nose daily.   No facility-administered encounter medications on file as of 04/23/2018.      Review of Systems  Constitutional:   No  weight loss, night sweats,  fevers, chills, fatigue, or  lassitude HEENT: +occasional thrush   No headaches,  Difficulty swallowing,  Tooth/dental problems, No sneezing, itching, ear ache, nasal congestion, post nasal drip  CV: No chest pain,  orthopnea, PND, swelling in lower extremities, anasarca, dizziness, palpitations, syncope  GI: No heartburn, indigestion, abdominal pain, nausea, vomiting, diarrhea, change in bowel habits, loss of appetite, bloody stools Resp: +occasional episodes of SOB No shortness of breath at rest.  No excess mucus, no productive cough,  No non-productive cough,  No coughing up of blood.  No change in color of mucus.  No wheezing.  No chest wall deformity Skin: no rash, lesions, no skin changes. GU: no dysuria, change in color of urine, no urgency or frequency.  No flank pain, no hematuria  MS:  No joint  pain or swelling.  No decreased range of motion.  No back pain. Psych:  No change in mood or affect. No depression or anxiety.  No memory loss.   Physical Exam  BP (!) 142/88   Pulse 61   Ht 5\' 11"  (1.803 m)   Wt 155 lb (70.3 kg)   SpO2 98%   BMI 21.62 kg/m   Wt Readings from Last 3 Encounters:  04/23/18 155 lb (70.3 kg)  02/10/16 162 lb (73.5 kg)  04/30/12 161 lb 12.8 oz (73.4 kg)    GEN: A/Ox3; pleasant , NAD, well nourished    HEENT:  [place]/AT,  EACs-clear, TMs-wnl, NOSE-clear, THROAT- +thrush with few ulcers   NECK:  Supple w/ fair ROM; no JVD; normal carotid impulses w/o bruits; no thyromegaly or nodules palpated; no lymphadenopathy.    RESP:  Clear  P & A;  slightly diminished breath sounds in bases, no wheezes/ rales/ or rhonchi. no accessory muscle use, no dullness to percussion  CARD:  RRR, no m/r/g, no peripheral  edema, pulses intact, no cyanosis or clubbing.  GI:   Soft & nt; nml bowel sounds; no organomegaly or masses detected.   Musco: Warm bil, no deformities or joint swelling noted.   Neuro: alert, no focal deficits noted.    Skin: Warm, no lesions or rashes    Lab Results:  CBC    Component Value Date/Time   WBC 5.8 02/10/2016 1141   RBC 5.05 02/10/2016 1141   HGB 17.2 Repeated and verified X2. (H) 02/10/2016 1141   HCT 49.3 02/10/2016 1141   PLT 220.0 02/10/2016 1141   MCV 97.7 02/10/2016 1141   MCHC 34.8 02/10/2016 1141   RDW 13.2 02/10/2016 1141   LYMPHSABS 1.4 02/10/2016 1141   MONOABS 0.7 02/10/2016 1141   EOSABS 0.1 02/10/2016 1141   BASOSABS 0.0 02/10/2016 1141    BMET No results found for: NA, K, CL, CO2, GLUCOSE, BUN, CREATININE, CALCIUM, GFRNONAA, GFRAA  BNP No results found for: BNP  ProBNP No results found for: PROBNP  Imaging: Dg Chest 2 View  Result Date: 04/23/2018 CLINICAL DATA:  No chest complaints.  History of asthma. EXAM: CHEST - 2 VIEW COMPARISON:  02/10/2016 FINDINGS: The heart size and mediastinal contours are within normal limits. Both lungs are clear. The visualized skeletal structures are unremarkable. IMPRESSION: No active cardiopulmonary disease. Electronically Signed   By: Elige Ko   On: 04/23/2018 09:39     Assessment & Plan:   Pleasant patient seen office today.  This is a stable interval for Mr. Marcello.  Patient is planning on moving to [place] in the next 3 to 4 months.  Discussed with patient he needs to follow-up with our office in 2 months or sooner to complete a pulmonary function test as well as to see Roy Thomas.  Patient says that he will call as soon as he knows when he is moving and when he can be back in [place] so that we we can coordinate this.  Patient is requesting that we continue to support and help manage his care when he moved to [place].  I told him that he needs to complete his appointment with Dr.  Kendrick Thomas before we can proceed forward with considering this.  Patient also requesting PRN prednisone doses for when he is traveling in [place].  I informed him that we would need to see him as well as discussed with Roy Thomas.  Also will need to complete pulmonary function testing.  FeNo today is 22.  Unfortunately spirometry is broken in the office and we are unable to obtain these results.  Try to coordinate patient to get pulmonary function test today at 2 PM, patient declined being able to do this he said today is a busy day.  Chest x-ray today looks good.  Thrush present on exam today.  Will write for nystatin.  Patient requests that this be sent to [facility].  We will do so today.  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  Contact our office as soon as you know when you will be able to coordinate a trip to [place] so we can get you scheduled with Roy Thomas as well as get your pulmonary function test scheduled.      Coral Ceo, NP 04/23/2018

## 2018-04-23 NOTE — Assessment & Plan Note (Signed)
FeNo today  >>>22 Come back in 2 months  >>>Pulmonary Function Test at next appointment    Continue Symbicort  >>>2 puffs in the morning, rinse mouth, 2 puffs 12 hours later, rinse mouth

## 2018-04-23 NOTE — Patient Instructions (Addendum)
FeNo today  >>>22 Come back in 2 months  >>>Pulmonary Function Test at next appointment    Continue Symbicort  >>>2 puffs in the morning, rinse mouth, 2 puffs 12 hours later, rinse mouth   Thrush  >>> Nystatin Rinse  >>> Will call order into [place] pharmacy >>>We will let them know that you will not be able to pick up until after 04/30/18  Please contact our office if you have any changes in your respiratory status, or any more exacerbations.  Also letter office know if you do get treated with any prednisone tapers for your respiratory status.  Please contact the office if your symptoms worsen or you have concerns that you are not improving.   Thank you for choosing [facility] for your healthcare, and for allowing us to partner with you on your healthcare journey. I am thankful to be able to provide care to you today.   Elisha HeadlandBrian Yechezkel Fertig FNP-C

## 2018-04-23 NOTE — Progress Notes (Signed)
Discussed results with patient in office today.  Nothing further is needed at this time.  Elisha HeadlandBrian Lovinia Snare, FNP

## 2018-04-25 ENCOUNTER — Other Ambulatory Visit: Payer: Self-pay | Admitting: Pulmonary Disease

## 2018-04-25 MED ORDER — PREDNISONE 20 MG PO TABS: ORAL_TABLET | ORAL | 0 refills | Status: DC

## 2018-05-01 ENCOUNTER — Telehealth: Payer: Self-pay | Admitting: Pulmonary Disease

## 2018-05-01 NOTE — Telephone Encounter (Signed)
Called and spoke with pt letting him know the acyclovir was avail OTC and did not require an Rx.  Pt expressed understanding. Nothing further needed.

## 2018-05-03 ENCOUNTER — Telehealth: Payer: Self-pay | Admitting: Pulmonary Disease

## 2018-05-03 NOTE — Telephone Encounter (Signed)
Spoke with pt. He is requesting a prescription for Acyclovir. Pt called in yesterday and was told this was OTC and it's not. Advised him that I would have to speak to Arlys JohnBrian about this.  Arlys JohnBrian - please advise if you would be willing to prescribe this. Thanks!

## 2018-05-03 NOTE — Telephone Encounter (Signed)
Patient needs to follow-up with primary care regarding this.  This is not something that we are managing.  Roy HeadlandBrian Mack, FNP

## 2018-05-03 NOTE — Telephone Encounter (Signed)
Attempted to contact pt. I did not receive an answer. There was no option for me to leave a message. Will try back.  

## 2018-05-04 NOTE — Telephone Encounter (Signed)
Attempted to contact pt. I did not receive an answer. There was no option for me to leave a message. Will try back.  

## 2018-05-08 NOTE — Telephone Encounter (Signed)
Attempted to contact pt. I did not receive an answer. There was no option for me to leave a message. Will try back.  

## 2018-05-09 NOTE — Telephone Encounter (Signed)
We have attempted to contact the pt several times with no success or call back from the pt. Per triage protocol, message will be closed.  

## 2018-06-27 ENCOUNTER — Ambulatory Visit: Payer: Self-pay | Admitting: Pulmonary Disease

## 2018-08-02 ENCOUNTER — Telehealth: Payer: Self-pay | Admitting: Pulmonary Disease

## 2018-08-02 NOTE — Telephone Encounter (Signed)
Per telephone encounter from 05/03/18 and per Arlys John, this will need to come from his PCP.  Attempted to call pt. I did not receive an answer. I have left a message for pt to return our call.

## 2018-08-02 NOTE — Telephone Encounter (Signed)
Spoke with pt. Pt is requesting a prescription for Acyclovir. He is aware of the information below. Pt became very upset and said that this prescription came from our office originally. Dr. Shelle Iron started this prescription for him back in 2013.  BQ - please advise if you would be willing to write this prescription for him.

## 2018-08-03 MED ORDER — ACYCLOVIR 200 MG PO CAPS: 400.0000 mg | ORAL_CAPSULE | Freq: Three times a day (TID) | ORAL | 1 refills | Status: DC | PRN

## 2018-08-03 NOTE — Telephone Encounter (Signed)
OK to refill

## 2018-08-03 NOTE — Telephone Encounter (Signed)
Called and spoke to patient, patient states he was taking it for cold sores.   BQ please advise?

## 2018-08-03 NOTE — Telephone Encounter (Signed)
I'm not sure what I'm treating.  What does he need the acyclovir for?

## 2018-08-03 NOTE — Addendum Note (Signed)
Addended by: Wyvonne Lenz on: 08/03/2018 03:41 PM   Modules accepted: Orders

## 2018-08-03 NOTE — Telephone Encounter (Signed)
Called and spoke with pt letting him know BQ said it would be okay for [place] to refill the acyclovir for him  Pt expressed understanding. Rx has been sent to preferred pharmacy. Nothing further needed.

## 2018-08-07 ENCOUNTER — Ambulatory Visit: Payer: Self-pay | Admitting: Pulmonary Disease

## 2018-08-24 ENCOUNTER — Other Ambulatory Visit: Payer: Self-pay | Admitting: Pulmonary Disease

## 2018-08-24 ENCOUNTER — Ambulatory Visit: Payer: Self-pay | Admitting: Pulmonary Disease

## 2018-08-24 DIAGNOSIS — J4530 Mild persistent asthma, uncomplicated: Secondary | ICD-10-CM

## 2018-08-27 ENCOUNTER — Ambulatory Visit (INDEPENDENT_AMBULATORY_CARE_PROVIDER_SITE_OTHER): Payer: Self-pay | Admitting: Pulmonary Disease

## 2018-08-27 ENCOUNTER — Encounter: Payer: Self-pay | Admitting: Pulmonary Disease

## 2018-08-27 VITALS — BP 122/74 | HR 65 | Ht 69.0 in | Wt 152.0 lb

## 2018-08-27 DIAGNOSIS — J4550 Severe persistent asthma, uncomplicated: Secondary | ICD-10-CM

## 2018-08-27 DIAGNOSIS — J4530 Mild persistent asthma, uncomplicated: Secondary | ICD-10-CM

## 2018-08-27 DIAGNOSIS — D721 Eosinophilia, unspecified: Secondary | ICD-10-CM

## 2018-08-27 DIAGNOSIS — B370 Candidal stomatitis: Secondary | ICD-10-CM

## 2018-08-27 LAB — PULMONARY FUNCTION TEST
DL/VA % pred: 94 %
DL/VA: 4.29 ml/min/mmHg/L
DLCO unc % pred: 100 %
DLCO unc: 31.19 ml/min/mmHg
FEF 25-75 PRE: 2.12 L/s
FEF 25-75 Post: 2.44 L/sec
FEF2575-%Change-Post: 15 %
FEF2575-%PRED-PRE: 74 %
FEF2575-%Pred-Post: 86 %
FEV1-%Change-Post: 4 %
FEV1-%PRED-POST: 98 %
FEV1-%PRED-PRE: 94 %
FEV1-PRE: 3.24 L
FEV1-Post: 3.39 L
FEV1FVC-%Change-Post: 2 %
FEV1FVC-%Pred-Pre: 90 %
FEV6-%CHANGE-POST: 2 %
FEV6-%PRED-POST: 111 %
FEV6-%Pred-Pre: 108 %
FEV6-POST: 4.84 L
FEV6-Pre: 4.74 L
FEV6FVC-%Change-Post: 0 %
FEV6FVC-%PRED-POST: 104 %
FEV6FVC-%Pred-Pre: 104 %
FVC-%Change-Post: 2 %
FVC-%Pred-Post: 106 %
FVC-%Pred-Pre: 104 %
FVC-Post: 4.86 L
FVC-Pre: 4.76 L
POST FEV6/FVC RATIO: 100 %
PRE FEV1/FVC RATIO: 68 %
Post FEV1/FVC ratio: 70 %
Pre FEV6/FVC Ratio: 100 %
RV % pred: 126 %
RV: 2.79 L
TLC % PRED: 111 %
TLC: 7.58 L

## 2018-08-27 MED ORDER — OSELTAMIVIR PHOSPHATE 75 MG PO CAPS: 75.0000 mg | ORAL_CAPSULE | Freq: Two times a day (BID) | ORAL | 0 refills | Status: AC

## 2018-08-27 MED ORDER — AZELASTINE HCL 0.15 % NA SOLN: 2.0000 | Freq: Every day | NASAL | 11 refills | Status: DC

## 2018-08-27 MED ORDER — ALBUTEROL SULFATE HFA 108 (90 BASE) MCG/ACT IN AERS: INHALATION_SPRAY | RESPIRATORY_TRACT | 11 refills | Status: DC

## 2018-08-27 MED ORDER — NYSTATIN 100000 UNIT/ML MT SUSP: 5.0000 mL | Freq: Four times a day (QID) | OROMUCOSAL | 2 refills | Status: DC

## 2018-08-27 MED ORDER — BUDESONIDE-FORMOTEROL FUMARATE 160-4.5 MCG/ACT IN AERO: 2.0000 | INHALATION_SPRAY | Freq: Two times a day (BID) | RESPIRATORY_TRACT | 11 refills | Status: DC

## 2018-08-27 MED ORDER — ACYCLOVIR 200 MG PO CAPS: 400.0000 mg | ORAL_CAPSULE | Freq: Three times a day (TID) | ORAL | 11 refills | Status: DC | PRN

## 2018-08-27 MED ORDER — PREDNISONE 20 MG PO TABS: 20.0000 mg | ORAL_TABLET | Freq: Every day | ORAL | 0 refills | Status: DC

## 2018-08-27 NOTE — Patient Instructions (Signed)
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: Fill a prescription of prednisone to have on hand in the event of illness Continue Symbicort 2 puffs twice a day no matter how you feel Practice good hand hygiene Stay active Tamiflu prescribed today to use for prophylaxis if exposed to flu: Use 1 tablet daily Use albuterol as needed for chest tightness wheezing or shortness of breath In the event of recurrent exacerbations I think he should strongly consider being treated with a drug called dupilumab, this should be available in [place]  History of oral HSV: Acyclovir refilled  Oral thrush: Richard mouth frequently Use nystatin as needed Consider using catheter over the next few days  Follow-up in 1 year or sooner if needed

## 2018-08-27 NOTE — Progress Notes (Signed)
PFT completed today.  

## 2018-08-27 NOTE — Progress Notes (Signed)
Subjective:    Patient ID: Roy Thomas, male    DOB: 1955-10-25, 62 y.o.   MRN: [id number]  Synopsis: This is a lifelong nonsmoker and a former patient of Dr. Shelle Iron who was first evaluated in [place] by pulmonary in 2007. At that time he presented with significant shortness of breath and cough and had a CT scan which showed significant tree-in-bud abnormalities in the bases of both lungs. A bronchoscopy was performed which showed elevated eosinophils (12%) and transbronchial biopsies which showed an eosinophilic infiltrate in the small vessels and in the alveolar septi. He was treated with prednisone and he had improvement in symptoms. Since then he's taken Symbicort and has done fairly well. He has been treated intermittently for asthmatic flares but has not had recurrence of the abnormal findings on chest x-ray.  Presented 2007 with severe dyspnea, cough, and abnormal cxr with pfts showing airflow obstruction. ++response to steroids Referred to [facility] in [place]:  Recommended VATS bx  but pt declined.   HPI Chief Complaint  Patient presents with  . Follow-up    better than average    Roy Thomas says that in 2017 he had more problems with dyspnea after I saw him.  He thinks that he may have set this off by riding his bike in hot weather too much.  He says that he had an illness in July of that year and by Christmas day he became incredibly ill and he had a ton of mucus in his chest and he couldn't lay flat.  He was very short of breath and the symptoms lasted for at least 2 months.  He said that it was May of th next year that he finally felt somewhat better.  He would get sick again repeatedly about every 4 months.  He says taht in summer 2018 he was in [place] because of work and he was exposed to a ton of smoke.  At this time he became quite ill again and was sick in bed for a week with dyspnea, cough.  He says that his friends were worried that he was going to die.  He eventually  recovered again over several months.  He says that by April 2019 he became ill again with some dyspnea and cough.  Since May 2019 he has been steadily recoverying.  He has been back to biking again and rock climbing.    Past Medical History:  Diagnosis Date  . Asthma   . Eosinophilic disorder         Review of Systems  Constitutional: Negative for fever and unexpected weight change.  HENT: Positive for congestion, postnasal drip and sinus pressure. Negative for dental problem, ear pain, nosebleeds, rhinorrhea, sneezing, sore throat and trouble swallowing.   Eyes: Negative for redness and itching.  Respiratory: Negative for cough, chest tightness, shortness of breath and wheezing.   Cardiovascular: Negative for palpitations and leg swelling.  Gastrointestinal: Negative for nausea and vomiting.  Genitourinary: Negative for dysuria.  Musculoskeletal: Negative for joint swelling.  Skin: Negative for rash.  Neurological: Negative for headaches.  Hematological: Does not bruise/bleed easily.  Psychiatric/Behavioral: Negative for dysphoric mood. The patient is not nervous/anxious.        Objective:   Physical Exam  Vitals:   08/27/18 1447  BP: 122/74  Pulse: 65  SpO2: 100%  Weight: 152 lb (68.9 kg)  Height: 5\' 9"  (1.753 m)   Room air  Gen: well appearing HENT: OP clear, TM's clear, neck supple PULM: Faint  wheezing left base B, normal percussion CV: RRR, no mgr, trace edema GI: BS+, soft, nontender Derm: no cyanosis or rash Psyche: normal mood and affect   Chest imaging: CT chest 2007:  Basilar interstitial and nodular infiltrates bilat with bilat hilar and mediastinal LN  Path: Bronch 2007:  BAL with 9550 cells, 12% eos, negative cultures TBBX 2007:  Eosinophilic infiltrate in alveolar septae, but not in alveolar spaces.  +eosinophils in walls of small arteries??     Labs: Blood eosinophilia 2007:  12% on diff IgE 141, HP panel negative, ANCA negative April 2017  eosinophil count 100 cells per microliter, spirometry normal  November 2019 pulmonary function testing ratio 68%, FEV1 3.39 L 98% predicted, 4% change with bronchodilator, forced vital capacity 4.9 L 106% predicted, total lung capacity 7.6 L 111% predicted, DLCO 31.19 100% predicted  Exhaled nitric oxide test July 2000 1922 ppb       Assessment & Plan:   Eosinophilic disorder  Mild persistent asthma without complication - Plan: nystatin (MYCOSTATIN) 100000 UNIT/ML suspension  Thrush - Plan: nystatin (MYCOSTATIN) 100000 UNIT/ML suspension  Severe persistent asthma without complication  Discussion: 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  He has had quite a bit of episodes of illness over the last several months however today he is as good as we have ever seen him.  His lung function testing is with when normal limits.  He is about to move to [place] and so he would like [place] to fill all of his prescriptions so that he can fill them in a pharmacy over there.  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: Fill a prescription of prednisone to have on hand in the event of illness Continue Symbicort 2 puffs twice a day no matter how you feel Practice good hand hygiene Stay active Tamiflu prescribed today to use for prophylaxis if exposed to flu: Use 1 tablet daily Use albuterol as needed for chest tightness wheezing or shortness of breath In the event of recurrent exacerbations I think he should strongly consider being treated with a drug called dupilumab, this should be available in [place]  History of oral HSV: Acyclovir refilled  Oral thrush: Richard mouth frequently Use nystatin as needed Consider using catheter over the next few days  Follow-up in 1 year or  sooner if needed  Your than 50% of this 42-minute visit spent face-to-face    Current Outpatient Medications:  .  acyclovir (ZOVIRAX) 200 MG capsule, Take 2 capsules (400 mg total) by mouth 3 (three) times daily as needed., Disp: 30 capsule, Rfl: 11 .  albuterol (PROAIR HFA) 108 (90 Base) MCG/ACT inhaler, inhale 2 puffs by mouth every 6 hours if needed, Disp: 1 Inhaler, Rfl: 11 .  budesonide-formoterol (SYMBICORT) 160-4.5 MCG/ACT inhaler, Inhale 2 puffs into the lungs 2 (two) times daily., Disp: 1 Inhaler, Rfl: 11 .  Azelastine HCl 0.15 % SOLN, Place 2 sprays into the nose daily., Disp: 30 mL, Rfl: 11 .  nystatin (MYCOSTATIN) 100000 UNIT/ML suspension, Take 5 mLs (500,000 Units total) by mouth 4 (four) times daily., Disp: 60 mL, Rfl: 2 .  oseltamivir (TAMIFLU) 75 MG capsule, Take 1 capsule (75 mg total) by mouth 2 (two) times daily for 5 days., Disp: 10 capsule, Rfl: 0 .  predniSONE (DELTASONE) 20 MG tablet, Take one tablet by mouth x 5 days prn, Take with food, use if  having an exacerbation. (Patient not taking: Reported on 08/27/2018), Disp: 30 tablet, Rfl: 0 .  predniSONE (DELTASONE) 20 MG tablet, Take 1 tablet (20 mg total) by mouth daily with breakfast., Disp: 30 tablet, Rfl: 0 .  triamcinolone (NASACORT ALLERGY 24HR) 55 MCG/ACT AERO nasal inhaler, Place 1 spray into the nose daily as needed. (Patient not taking: Reported on 08/27/2018), Disp: 1 Inhaler, Rfl: 6

## 2018-10-15 ENCOUNTER — Telehealth: Payer: Self-pay | Admitting: Pulmonary Disease

## 2018-10-15 NOTE — Telephone Encounter (Signed)
Patient returned phone call. °

## 2018-10-15 NOTE — Telephone Encounter (Signed)
Call made to patient, made patient aware we are not able to send anything to [place]. Patient states he has done it before and he does not understand why we cannot do it now. I explained that our system is not set up to be able to prescribe to [place]. Patient states his chest is filling up with mucous and it is his chest that should we should be concerned with and he does not understand why he cannot get his prednisone order sent to [place]. Patient continued to repeat over and over how this was discussed in the office visit and several staff member were aware. He states the printed prescriptions that were given to him was for his routine dose of prednisone not his exacerbations.  Patient went on to explain that he could send me a copy of the script and that we should abide by the protocol that BQ already has in place to him. Phone call has been routed to BT for f/u as phone call has been transferred.

## 2018-10-15 NOTE — Telephone Encounter (Signed)
Patient was transferred to speak with me. Patient stated that he has a back up Rx for prednisone if needed and has his maintenance Symbicort. Patient reported that he would like something prescribed for him, sent to [place]. Patient stated that Dr. Kendrick FriesMcQuaid has done this for him in the past. Advised him that the NP did not feel comfortable sending anything in without seeing him. Advised patient to try to be seen over there due to the report of the chest filling up with mucous.  Patient wants message sent to Dr. Kendrick FriesMcQuaid, patient aware the BQ is on vacation, however back in the hospital 10/22/18. Patient stated he will check back next week to see if Dr. Kendrick FriesMcQuaid has read this message.   Routing to BQ.

## 2018-10-15 NOTE — Telephone Encounter (Signed)
LMTCB. We are not able to electronically prescribe to [place], [place].

## 2018-10-15 NOTE — Telephone Encounter (Signed)
Called and spoke with pt who stated within the last 10 days, pt has had a lot of congestion in his chest, sore throat, head congestion, and also increased SOB.  Pt states he is coughing up mucus and states it is yellow in color. Pt is unsure is he has been running a fever.  Pt does go back and forth from [place] to [place] but pt stated he has temporarily relocated to [place] which pt stated BQ does know this.  Pt would like to have something be prescribed to help with his symptoms as he is unable to come to our office for an appt due to being in [place].  Sending to APP of day. Arlys JohnBrian, please advise on this for pt. Thanks!

## 2018-10-15 NOTE — Telephone Encounter (Signed)
I am sorry to hear the patient is not feeling well.  If the patient is located in [place], [place] I do not believe that we can E prescribe him any sort of medications to help with the symptoms.  Unsure what help we can do located in [place] and him being in [place].  I suggest that he follow-up with a provider in [place], [place] to receive treatment.  Elisha HeadlandBrian Marquon Alcala FNP

## 2018-10-19 NOTE — Telephone Encounter (Signed)
LMTCB for pt 

## 2018-10-19 NOTE — Telephone Encounter (Signed)
Send a fax with Rx  Prednisone 60mg  daily x 3 days, 40mg  daily x 3 days, 20mg  daily x 3 days Doxycycline 100mg  po bid x 5 days  Alternatively he may be able to just show this note from me stating this is what I want him to take as often a prescription is not needed to fill an Rx in other countries.

## 2018-10-22 NOTE — Telephone Encounter (Signed)
Called and spoke with patient, advised him of response below, patient stated that he had to have the actual prescription to show the pharmacy in [place] to get the medication. Patient requested that we send it through a text message. Advised patient that per protocol I could not do that. Patient stated that he would see if he could find a fax number for us to send it to and call us back. When speaking with Robynn Panelise she stated that we could upload the prescription in mychart to see if the patient could print that off and take it with him. I called patient back to see if he was active in mychart so that we could do this. I was unable to reach patient, left him a message to give us a call back.

## 2018-10-23 NOTE — Telephone Encounter (Signed)
Called and spoke with pt. Pt stated he will not have access to a fax machine for a few days. Asked pt if he had mychart and pt stated he did not have it but when I stated to him that we might be able to upload the Rx via mychart, pt stated that he would go to try to get mychart and would call our office back Thursday, 10/25/18 if he had been able to get access to mychart.

## 2018-10-29 NOTE — Telephone Encounter (Signed)
Patient called back stating MyChart is unavailable outside of the [place].  He states tomorrow he will try a fax number.  CB is [phone].

## 2018-10-29 NOTE — Telephone Encounter (Signed)
Will hold in triage until tomorrow, 10/30/18.

## 2018-10-30 MED ORDER — PREDNISONE 20 MG PO TABS: ORAL_TABLET | ORAL | 0 refills | Status: DC

## 2018-10-30 MED ORDER — DOXYCYCLINE HYCLATE 100 MG PO TABS: 100.0000 mg | ORAL_TABLET | Freq: Two times a day (BID) | ORAL | 0 refills | Status: DC

## 2018-10-30 NOTE — Telephone Encounter (Signed)
Pt is returning call. Cb is [phone].

## 2018-10-30 NOTE — Telephone Encounter (Signed)
I have attempted to fax Rx's but according to [facility], we do not have an international plan on our fax machine. I called pt to let him know and he would like [place] to mail the Rx's to him but through [employer]. He states he has an account with them and will call us back to give [place] the information so he can cover the cost. Will await return call for information. He also requested we print out a copy of all Rx's that were renewed in November. I have attached this to the prescriptions and placed into the triage bin. Will hold in triage.

## 2018-10-30 NOTE — Telephone Encounter (Signed)
Called patient, unable to reach left message to give us a call back. 

## 2018-10-30 NOTE — Telephone Encounter (Signed)
Spoke with pt, he states mychart is unavailable and provided the fax number we could try to fax these prescriptions.   [place] code 33  [phone]  Attn: Alberteen Spindle

## 2018-10-31 NOTE — Telephone Encounter (Signed)
Spoke with patient this morning-states he has not been able to get [employer] information but should be able to do so today by closing or first thing Thursday morning.

## 2018-11-01 ENCOUNTER — Telehealth: Payer: Self-pay | Admitting: Pulmonary Disease

## 2018-11-01 DIAGNOSIS — B370 Candidal stomatitis: Secondary | ICD-10-CM

## 2018-11-01 DIAGNOSIS — J4530 Mild persistent asthma, uncomplicated: Secondary | ICD-10-CM

## 2018-11-01 MED ORDER — AZELASTINE HCL 0.15 % NA SOLN: 2.0000 | Freq: Every day | NASAL | 11 refills | Status: DC

## 2018-11-01 MED ORDER — NYSTATIN 100000 UNIT/ML MT SUSP: 5.0000 mL | Freq: Four times a day (QID) | OROMUCOSAL | 2 refills | Status: DC

## 2018-11-01 MED ORDER — BUDESONIDE-FORMOTEROL FUMARATE 160-4.5 MCG/ACT IN AERO: 2.0000 | INHALATION_SPRAY | Freq: Two times a day (BID) | RESPIRATORY_TRACT | 11 refills | Status: DC

## 2018-11-01 MED ORDER — ACYCLOVIR 200 MG PO CAPS: 400.0000 mg | ORAL_CAPSULE | Freq: Three times a day (TID) | ORAL | 11 refills | Status: DC | PRN

## 2018-11-01 MED ORDER — TRIAMCINOLONE ACETONIDE 55 MCG/ACT NA AERO: 1.0000 | INHALATION_SPRAY | Freq: Every day | NASAL | 6 refills | Status: DC | PRN

## 2018-11-01 MED ORDER — ALBUTEROL SULFATE HFA 108 (90 BASE) MCG/ACT IN AERS: INHALATION_SPRAY | RESPIRATORY_TRACT | 11 refills | Status: DC

## 2018-11-01 NOTE — Telephone Encounter (Signed)
See phone note dated 11/01/2018. Will close this encounter. This has been followed up on.

## 2018-11-01 NOTE — Telephone Encounter (Signed)
Called and spoke with patient, see phone note also dated 10/15/2018. Patient has given [place] a mailing address to mail the prescriptions to. From that address they will then be forward to the patient is [place]. While on the phone with the patient he stated that he would need actual printed prescriptions of all medications written by BQ not just the medication name printed. I have printed these for the patient. While on the phone with the patient I verified the address we were to send the prescriptions to. The address is [address] [place], [place] [address] with attention to Roselie Awkward. I have placed these in the outgoing mail. Nothing further needed. Will also close other encounter.

## 2018-11-19 ENCOUNTER — Telehealth: Payer: Self-pay | Admitting: Pulmonary Disease

## 2018-11-19 DIAGNOSIS — B370 Candidal stomatitis: Secondary | ICD-10-CM

## 2018-11-19 DIAGNOSIS — J4530 Mild persistent asthma, uncomplicated: Secondary | ICD-10-CM

## 2018-11-19 NOTE — Telephone Encounter (Signed)
Called and spoke with Patient.  He is requesting that prescriptions fax be to [phone]. He is very angry, because he is in [place] and he hasn't got prescriptions.  Per 11/01/2018 phone note, prescriptions were printed and mailed per Patient request.  He has not received mailed prescriptions.  He wanted to know why Dr. Kendrick Fries was not in the office, and was very upset that he was not in the office.  He is requesting that a message be sent to Dr Kendrick Fries, because he needs all his medications.  Message to Dr. Kendrick Fries per Patient request

## 2018-11-19 NOTE — Telephone Encounter (Signed)
OK to fax his medications to that number

## 2018-11-20 MED ORDER — BUDESONIDE-FORMOTEROL FUMARATE 160-4.5 MCG/ACT IN AERO: 2.0000 | INHALATION_SPRAY | Freq: Two times a day (BID) | RESPIRATORY_TRACT | 11 refills | Status: DC

## 2018-11-20 MED ORDER — ALBUTEROL SULFATE HFA 108 (90 BASE) MCG/ACT IN AERS: INHALATION_SPRAY | RESPIRATORY_TRACT | 11 refills | Status: DC

## 2018-11-20 MED ORDER — AZELASTINE HCL 0.15 % NA SOLN: 2.0000 | Freq: Every day | NASAL | 11 refills | Status: AC

## 2018-11-20 MED ORDER — NYSTATIN 100000 UNIT/ML MT SUSP: 5.0000 mL | Freq: Four times a day (QID) | OROMUCOSAL | 5 refills | Status: DC

## 2018-11-20 MED ORDER — ACYCLOVIR 200 MG PO CAPS: 400.0000 mg | ORAL_CAPSULE | Freq: Three times a day (TID) | ORAL | 11 refills | Status: AC | PRN

## 2018-11-20 MED ORDER — TRIAMCINOLONE ACETONIDE 55 MCG/ACT NA AERO: 1.0000 | INHALATION_SPRAY | Freq: Every day | NASAL | 11 refills | Status: AC | PRN

## 2018-11-20 NOTE — Telephone Encounter (Signed)
Tonya - since Dr. Kendrick Fries is not in the office, would you being willing to sign these prescriptions?

## 2018-11-20 NOTE — Telephone Encounter (Signed)
Rxs have been printed, signed and faxed to the number that was given to [place]. Pt is aware. Nothing further was needed.

## 2018-11-20 NOTE — Telephone Encounter (Signed)
Yes. That will be fine!

## 2019-06-11 NOTE — Telephone Encounter (Signed)
06/11/2019 1439  Okay to generate note.  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   Please also explained that patient would be high risk of complications for SARS-CoV-2 if he did contract this.  We would request that the patient's wife still would be able to work for mom and continue to this to decrease risk to the patient.  Please also explained to the patient that Dr. Lake Bells is no longer returning to the clinic as he is working full-time in the [employer] [facility] [employer].  Dr. Lake Bells is still a member of this team in practice he just no longer will be seeing patients in clinic.  We are working on establishing his patients with other pulmonologist in our practice.  Please see if the patient has a preference on seeing another provider in our practice.  If the patient does not have a preference please establish him with a different pulmonary provider here.  Looks like patient is due for an appointment in November/2020 based off of last office note by Dr. Lake Bells in November/2019.  Wyn Quaker, FNP

## 2019-06-13 ENCOUNTER — Other Ambulatory Visit: Payer: Self-pay

## 2020-08-03 ENCOUNTER — Telehealth: Payer: Self-pay | Admitting: Pulmonary Disease

## 2020-08-04 NOTE — Telephone Encounter (Signed)
Pt will need to est with new provider  LMTCB x 1

## 2020-08-04 NOTE — Telephone Encounter (Signed)
Patient would like to speak to Dr. Kendrick Fries. Patient phone number is [phone].

## 2020-08-04 NOTE — Telephone Encounter (Signed)
Spoke with the pt  I explained to him that Dr Kendrick Fries is based in the hospital only now and will not be able to return his call  I advised he was last seen in 2019 and will need to establish with a new physician  Pt refused, stating he lives out of state and will need to find another doctor  He requested that I forward him all of his medical records  I have given him medical records phone number per protocol

## 2020-09-22 ENCOUNTER — Encounter: Payer: Self-pay | Admitting: Primary Care

## 2020-09-22 ENCOUNTER — Telehealth: Payer: Self-pay | Admitting: Pulmonary Disease

## 2020-09-22 ENCOUNTER — Telehealth (INDEPENDENT_AMBULATORY_CARE_PROVIDER_SITE_OTHER): Payer: Self-pay | Admitting: Primary Care

## 2020-09-22 DIAGNOSIS — J4530 Mild persistent asthma, uncomplicated: Secondary | ICD-10-CM

## 2020-09-22 DIAGNOSIS — B370 Candidal stomatitis: Secondary | ICD-10-CM

## 2020-09-22 MED ORDER — NYSTATIN 100000 UNIT/ML MT SUSP: 5.0000 mL | Freq: Four times a day (QID) | OROMUCOSAL | 5 refills | Status: AC

## 2020-09-22 MED ORDER — BUDESONIDE-FORMOTEROL FUMARATE 160-4.5 MCG/ACT IN AERO: 2.0000 | INHALATION_SPRAY | Freq: Two times a day (BID) | RESPIRATORY_TRACT | 3 refills | Status: AC

## 2020-09-22 MED ORDER — ALBUTEROL SULFATE HFA 108 (90 BASE) MCG/ACT IN AERS: INHALATION_SPRAY | RESPIRATORY_TRACT | 5 refills | Status: AC

## 2020-09-22 NOTE — Telephone Encounter (Signed)
Patient is returning phone call. Patient phone number is [phone].

## 2020-09-22 NOTE — Progress Notes (Signed)
Virtual Visit via VIDEO Note  I connected with Roy Thomas on 09/22/20 at  3:00 PM EST by VIDEO enabled telemedicine application and verified that I am speaking with the correct person using two identifiers.  Location: Patient: Home Provider: Office   I discussed the limitations of evaluation and management by telemedicine and the availability of in person appointments. The patient expressed understanding and agreed to proceed.   Synopsis:  This is a lifelong nonsmoker and a former patient of Dr. Shelle Iron who was first evaluated in [place] by pulmonary in 2007. At that time he presented with significant shortness of breath and cough and had a CT scan which showed significant tree-in-bud abnormalities in the bases of both lungs. A bronchoscopy was performed which showed elevated eosinophils (12%) and transbronchial biopsies which showed an eosinophilic infiltrate in the small vessels and in the alveolar septi. He was treated with prednisone and he had improvement in symptoms. Since then he's taken Symbicort and has done fairly well. He has been treated intermittently for asthmatic flares but has not had recurrence of the abnormal findings on chest x-ray.  Presented 2007 with severe dyspnea, cough, and abnormal cxr with pfts showing airflow obstruction. ++response to steroids Referred to [facility] in [place]:  Recommended VATS bx  but pt declined.  History of Present Illness: 64 year old male, never smoked. PMH significant for mild persistent asthma, eosinophilic disorder and thrush. Former patient of Dr. Kendrick Fries, last seen on 08/27/2018.   09/22/2020 - interim hx  Patient contacted today for overdue annual follow-up for asthma. He splits his time between [place] and he is intermittently in [place]. He has been in [place] the last several years, he had not anticipated being back until 2022. Dr. Kendrick Fries was previously managing his prescriptions. He needs refill of Symbicort, Albuterol rescue  inhaler and Nystatin. He has been doing well, no exacerbations requiring oral steroids over the last 1-2 years. He is wanting to have some baseline testing repeated. He is well controled on Symbicort 160. He uses his rescue inhaler less than twice a week. He gets thrush symptoms several times a year, they typically lasts for about three days. Uses Nysatin as needed. He previously used an aerochamber which helped decrease frequency but it got lost. He has been vaccinated for covid19 and received booster. He has not have influenza vaccine.   TESTING: Chest imaging: CT chest 2007:  Basilar interstitial and nodular infiltrates bilat with bilat hilar and mediastinal LN  Path: Bronch 2007:  BAL with 9550 cells, 12% eos, negative cultures TBBX 2007:  Eosinophilic infiltrate in alveolar septae, but not in alveolar spaces.  +eosinophils in walls of small arteries??    Labs: Blood eosinophilia 2007:  12% on diff IgE 141, HP panel negative, ANCA negative April 2017 eosinophil count 100 cells per microliter, spirometry normal  November 2019 pulmonary function testing ratio 68%, FEV1 3.39 L 98% predicted, 4% change with bronchodilator, forced vital capacity 4.9 L 106% predicted, total lung capacity 7.6 L 111% predicted, DLCO 31.19 100% predicted  Exhaled nitric oxide test July 2000 1922 ppb  Observations/Objective:  Patient appears well; able to speak in full sentences No overt shortness of breath or wheezing  _______________________________________________________________ Patient is interested in having repeat Spirometry or PFTs at [facility] [place]  Phone [phone]  [facility]  Fax [phone]  Assessment and Plan: Former patient of Dr. Henrene Pastor, he has not been seen since 2019. He splits his time between [place] and intermittent is  in [place]. He would like to stay as a patient with [place] [facility] pulmonary. Asthma has been well controlled. No  exacerbation requiring oral steriods in the last year. Maintained on Symbicort 160 two puffs twice daily. Uses SABA rescue inhaler <2 times a week. Dyspnea is currently only exercise induced. He is looking to have repeat Spirometry/PFTs in [place] and is wondering if we can order these. I let him know that we will need to check to see if this is possible as it is out of state. Recommended he make an apt with a new [place] pulmonary provider with our office next time he is in [place]. Refills sent to pharmacy.   Eosinophilic asthma: - Stable interval; No exacerbations requiring oral steriods. Rare SABA use.  - Continue Symbicort 160 two puff twice daily; prn albuterol 2 puffs every 4-6 hours as needed for breakthrough shortness of breath/wheezing  - Needs to establish with new [facility] pulmonary provider in office when back in [place]   Follow Up Instructions:  - 6-12 months with NEW [facility] pulmonary MD only    I discussed the assessment and treatment plan with the patient. The patient was provided an opportunity to ask questions and all were answered. The patient agreed with the plan and demonstrated an understanding of the instructions.   The patient was advised to call back or seek an in-person evaluation if the symptoms worsen or if the condition fails to improve as anticipated.  I provided 40 minutes of non-face-to-face time during this encounter.   Glenford Bayley, NP

## 2020-09-22 NOTE — Telephone Encounter (Signed)
lmtcb for pt. Dr. Kendrick Fries no longer sees patients in clinic.  Patient has not been seen in clinic since 2019.  If he is having acute issues he needs to establish with a new provider.

## 2020-09-22 NOTE — Telephone Encounter (Signed)
Spoke with pt, who is requesting refills on Symbicort 160, albuterol HFA, and nystatin.   Pt has not been seen since 2019 and is now living in [place].  I advised that Dr. Kendrick Fries no longer sees patients in clinic, and that he has not been seen in over 2 years.  Patient called 07/2020 for the same reason and was advised that he needed to establish care locally.   Patient was not satisfied with these same recommendations that he would need to be seen in our office to establish care with a new provider for [place] to continue managing his medications.  Patient is requesting to speak to a supervisor regarding this situation.   Routing to [employer] for follow-up.   I am also routing message to Arlys John to ensure that this is proper protocol per providers.  Thanks!

## 2020-09-22 NOTE — Telephone Encounter (Signed)
I have contacted the patient.  I scheduled the Virtual Visit as patient needed medication and states he could be able to make it to the state for an appointment with our office.    Will close message as patient had appointment

## 2020-09-22 NOTE — Patient Instructions (Addendum)
Continue Symbicort 160 two puff twice daily. Use albuterol 2 puffs every 4-6 hours as needed for breakthrough shortness of breath. Sent refills to [facility]   I will discuss follow-up pulmonary function testing with Dr. Kendrick Fries, I am not clear if we can order these tests to be performed in a different state than here. If unable, I would recommend getting them done next time you are in [place]. Ultimately you may need to establish with either a new PCP who can manage your asthma, a pulmonary doctor or even an allergy specialist in [place]. You should have provider in state where you spend the majority of your time.   Follow-up: - 6-12 months with new [place] pulmonary provider. Recommend Dr. Isaiah Serge, Dr. Celine Mans or Dr. Francine Graven    Asthma, Adult  Asthma is a long-term (chronic) condition that causes recurrent episodes in which the airways become tight and narrow. The airways are the passages that lead from the nose and mouth down into the lungs. Asthma episodes, also called asthma attacks, can cause coughing, wheezing, shortness of breath, and chest pain. The airways can also fill with mucus. During an attack, it can be difficult to breathe. Asthma attacks can range from minor to life threatening. Asthma cannot be cured, but medicines and lifestyle changes can help control it and treat acute attacks. What are the causes? This condition is believed to be caused by inherited (genetic) and environmental factors, but its exact cause is not known. There are many things that can bring on an asthma attack or make asthma symptoms worse (triggers). Asthma triggers are different for each person. Common triggers include:  Mold.  Dust.  Cigarette smoke.  Cockroaches.  Things that can cause allergy symptoms (allergens), such as animal dander or pollen from trees or grass.  Air pollutants such as household cleaners, wood smoke, smog, or Therapist, occupational.  Cold air, weather changes, and winds  (which increase molds and pollen in the air).  Strong emotional expressions such as crying or laughing hard.  Stress.  Certain medicines (such as aspirin) or types of medicines (such as beta-blockers).  Sulfites in foods and drinks. Foods and drinks that may contain sulfites include dried fruit, potato chips, and sparkling grape juice.  Infections or inflammatory conditions such as the flu, a cold, or inflammation of the nasal membranes (rhinitis).  Gastroesophageal reflux disease (GERD).  Exercise or strenuous activity. What are the signs or symptoms? Symptoms of this condition may occur right after asthma is triggered or many hours later. Symptoms include:  Wheezing. This can sound like whistling when you breathe.  Excessive nighttime or early morning coughing.  Frequent or severe coughing with a common cold.  Chest tightness.  Shortness of breath.  Tiredness (fatigue) with minimal activity. How is this diagnosed? This condition is diagnosed based on:  Your medical history.  A physical exam.  Tests, which may include: ? Lung function studies and pulmonary studies (spirometry). These tests can evaluate the flow of air in your lungs. ? Allergy tests. ? Imaging tests, such as X-rays. How is this treated? There is no cure for this condition, but treatment can help control your symptoms. Treatment for asthma usually involves:  Identifying and avoiding your asthma triggers.  Using medicines to control your symptoms. Generally, two types of medicines are used to treat asthma: ? Controller medicines. These help prevent asthma symptoms from occurring. They are usually taken every day. ? Fast-acting reliever or rescue medicines. These quickly relieve asthma symptoms by widening  the narrow and tight airways. They are used as needed and provide short-term relief.  Using supplemental oxygen. This may be needed during a severe episode.  Using other medicines, such  as: ? Allergy medicines, such as antihistamines, if your asthma attacks are triggered by allergens. ? Immune medicines (immunomodulators). These are medicines that help control the immune system.  Creating an asthma action plan. An asthma action plan is a written plan for managing and treating your asthma attacks. This plan includes: ? A list of your asthma triggers and how to avoid them. ? Information about when medicines should be taken and when their dosage should be changed. ? Instructions about using a device called a peak flow meter. A peak flow meter measures how well the lungs are working and the severity of your asthma. It helps you monitor your condition. Follow these instructions at home: Controlling your home environment Control your home environment in the following ways to help avoid triggers and prevent asthma attacks:  Change your heating and air conditioning filter regularly.  Limit your use of fireplaces and wood stoves.  Get rid of pests (such as roaches and mice) and their droppings.  Throw away plants if you see mold on them.  Clean floors and dust surfaces regularly. Use unscented cleaning products.  Try to have someone else vacuum for you regularly. Stay out of rooms while they are being vacuumed and for a short while afterward. If you vacuum, use a dust mask from a hardware store, a double-layered or microfilter vacuum cleaner bag, or a vacuum cleaner with a HEPA filter.  Replace carpet with wood, tile, or vinyl flooring. Carpet can trap dander and dust.  Use allergy-proof pillows, mattress covers, and box spring covers.  Keep your bedroom a trigger-free room.  Avoid pets and keep windows closed when allergens are in the air.  Wash beddings every week in hot water and dry them in a dryer.  Use blankets that are made of polyester or cotton.  Clean bathrooms and kitchens with bleach. If possible, have someone repaint the walls in these rooms with  mold-resistant paint. Stay out of the rooms that are being cleaned and painted.  Wash your hands often with soap and water. If soap and water are not available, use hand sanitizer.  Do not allow anyone to smoke in your home. General instructions  Take over-the-counter and prescription medicines only as told by your health care provider. ? Speak with your health care provider if you have questions about how or when to take the medicines. ? Make note if you are requiring more frequent dosages.  Do not use any products that contain nicotine or tobacco, such as cigarettes and e-cigarettes. If you need help quitting, ask your health care provider. Also, avoid being exposed to secondhand smoke.  Use a peak flow meter as told by your health care provider. Record and keep track of the readings.  Understand and use the asthma action plan to help minimize, or stop an asthma attack, without needing to seek medical care.  Make sure you stay up to date on your yearly vaccinations as told by your health care provider. This may include vaccines for the flu and pneumonia.  Avoid outdoor activities when allergen counts are high and when air quality is low.  Wear a ski mask that covers your nose and mouth during outdoor winter activities. Exercise indoors on cold days if you can.  Warm up before exercising, and take time for a cool-down  period after exercise.  Keep all follow-up visits as told by your health care provider. This is important. Where to find more information  For information about asthma, turn to the Centers for Disease Control and Prevention at http://www.mills-berg.com/.htm  For air quality information, turn to [employer] at [employer] Contact a health care provider if:  You have wheezing, shortness of breath, or a cough even while you are taking medicine to prevent attacks.  The mucus you cough up (sputum) is thicker than usual.  Your sputum changes from clear or white to  yellow, green, gray, or bloody.  Your medicines are causing side effects, such as a rash, itching, swelling, or trouble breathing.  You need to use a reliever medicine more than 2-3 times a week.  Your peak flow reading is still at 50-79% of your personal best after following your action plan for 1 hour.  You have a fever. Get help right away if:  You are getting worse and do not respond to treatment during an asthma attack.  You are short of breath when at rest or when doing very little physical activity.  You have difficulty eating, drinking, or talking.  You have chest pain or tightness.  You develop a fast heartbeat or palpitations.  You have a bluish color to your lips or fingernails.  You are light-headed or dizzy, or you faint.  Your peak flow reading is less than 50% of your personal best.  You feel too tired to breathe normally. Summary  Asthma is a long-term (chronic) condition that causes recurrent episodes in which the airways become tight and narrow. These episodes can cause coughing, wheezing, shortness of breath, and chest pain.  Asthma cannot be cured, but medicines and lifestyle changes can help control it and treat acute attacks.  Make sure you understand how to avoid triggers and how and when to use your medicines.  Asthma attacks can range from minor to life threatening. Get help right away if you have an asthma attack and do not respond to treatment with your usual rescue medicines. This information is not intended to replace advice given to you by your health care provider. Make sure you discuss any questions you have with your health care provider. Document Revised: 12/13/2018 Document Reviewed: 11/14/2016 Elsevier Patient Education  2020 [employer].

## 2020-09-22 NOTE — Telephone Encounter (Signed)
09/22/2020  Yes this is our policy the patient has not been seen within the last year we cannot do refills.  If he is now living full-time in [place] I would recommend that he establish with a local pulmonologist there.  If he is still wanting to be established with our office this would require that he have office visits with [place].  These cannot always be virtual.  I can see that he has had a virtual appointment scheduled with [facility] NP today.  Would recommend based off of clinical history that he potentially established with a new pulmonologist here as Dr. Kendrick Fries still works with our practice but does not see patients in the outpatient setting.  Would also recommend that clinical supervisor still contact the patient to make sure that he is fully aware of our office policies.  Elisha Headland, FNP

## 2020-09-25 ENCOUNTER — Telehealth: Payer: Self-pay | Admitting: Primary Care

## 2020-09-25 DIAGNOSIS — J4530 Mild persistent asthma, uncomplicated: Secondary | ICD-10-CM

## 2020-09-25 NOTE — Telephone Encounter (Signed)
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 This is  policy for APPs. Recommend he either needs to establish with a PCP in [place] if this is where he spend the majority of his time. They should be able to accomodate his needs. Otherwise he can discuss when he is back in  with new [facility] pulmonary provider. Hope this helps, sorry I could not get testing done. Refills were sent. Let me know if there is anything else I can do.

## 2020-09-25 NOTE — Telephone Encounter (Signed)
Spoke with Dr. Kendrick Fries, his former pulmonary MD. He is ok with ordering follow-up testing. Please put order in for CBC with diff, IgE and spirometry. Can you please notify patient and CC Cordelia Pen after and she will print orders and fax.

## 2020-09-25 NOTE — Telephone Encounter (Signed)
Called and spoke with pt letting him know the info stated by [facility] and that Dr. Kendrick Fries was fine with [place] ordering the PFT and labwork under him and pt verbalized understanding. Stated to pt that he will be able to get this done near where he lives in [place].  While speaking with pt, pt requested to have the labwork and PFT done at either [facility] in [place], [place] or [facility] in [place], [place] both which are places where he has had spirometry/PFT and labwork done at in the past.  Routing to [place], [facility]. Please advise. Orders need to be printed out and faxed to location where pt will be getting tests scheduled at and then pt also will need to be made aware of when and where the tests will be performed.

## 2020-09-28 NOTE — Telephone Encounter (Signed)
The info that had been given to me was that pt wanted to go to [facility].  I called then and they only do in-house labs.  They told me [facility] (ph # [phone])  is right up the road from them and they will be able to do spirometry and labs.  Their address is [id number] [place].  I called & was told to fax spirometry order to [phone] and they will call pt to set it up.  Ph # [phone].  I was told to fax lab orders to [phone] & to just tell pt once labs are faxed he can just go to the lab there and get get them drawn.  I faxed all orders as requested.  I called the pt & made him aware of where orders were sent.  He stated ok.  Nothing further needed.
